# Patient Record
Sex: Male | Born: 1955
Health system: Southern US, Community
[De-identification: ages and names within clinical notes are randomized; demographics above are authoritative.]

## PROBLEM LIST (undated history)

## (undated) DIAGNOSIS — N529 Male erectile dysfunction, unspecified: Secondary | ICD-10-CM

## (undated) DIAGNOSIS — I447 Left bundle-branch block, unspecified: Secondary | ICD-10-CM

## (undated) DIAGNOSIS — I10 Essential (primary) hypertension: Secondary | ICD-10-CM

## (undated) DIAGNOSIS — E785 Hyperlipidemia, unspecified: Secondary | ICD-10-CM

## (undated) DIAGNOSIS — I428 Other cardiomyopathies: Secondary | ICD-10-CM

## (undated) DIAGNOSIS — S4990XA Unspecified injury of shoulder and upper arm, unspecified arm, initial encounter: Secondary | ICD-10-CM

## (undated) DIAGNOSIS — G4733 Obstructive sleep apnea (adult) (pediatric): Secondary | ICD-10-CM

## (undated) DIAGNOSIS — Y33XXXA Other specified events, undetermined intent, initial encounter: Secondary | ICD-10-CM

## (undated) DIAGNOSIS — E669 Obesity, unspecified: Secondary | ICD-10-CM

## (undated) HISTORY — PX: WRIST SURGERY: SHX841

## (undated) HISTORY — DX: Unspecified injury of shoulder and upper arm, unspecified arm, initial encounter: S49.90XA

## (undated) HISTORY — DX: Male erectile dysfunction, unspecified: N52.9

## (undated) HISTORY — PX: SHOULDER SURGERY: SHX246

## (undated) HISTORY — DX: Other specified events, undetermined intent, initial encounter: Y33.XXXA

## (undated) HISTORY — PX: UMBILICAL HERNIA REPAIR: SHX196

## (undated) HISTORY — DX: Essential (primary) hypertension: I10

## (undated) HISTORY — PX: LUMBAR DISC SURGERY: SHX700

## (undated) HISTORY — PX: KNEE SURGERY: SHX244

## (undated) HISTORY — DX: Other cardiomyopathies: I42.8

## (undated) HISTORY — DX: Obesity, unspecified: E66.9

## (undated) HISTORY — DX: Obstructive sleep apnea (adult) (pediatric): G47.33

## (undated) HISTORY — PX: APPENDECTOMY: SHX54

## (undated) HISTORY — DX: Hyperlipidemia, unspecified: E78.5

## (undated) HISTORY — DX: Left bundle-branch block, unspecified: I44.7

---

## 2006-11-22 ENCOUNTER — Inpatient Hospital Stay: Payer: Self-pay | Admitting: Vascular Surgery

## 2009-11-29 ENCOUNTER — Ambulatory Visit: Payer: Self-pay | Admitting: Orthopaedic Surgery

## 2010-01-13 ENCOUNTER — Encounter: Payer: Self-pay | Admitting: Internal Medicine

## 2010-01-16 ENCOUNTER — Telehealth (INDEPENDENT_AMBULATORY_CARE_PROVIDER_SITE_OTHER): Payer: Self-pay | Admitting: *Deleted

## 2010-01-17 ENCOUNTER — Ambulatory Visit: Payer: Self-pay | Admitting: Cardiovascular Disease

## 2010-01-17 ENCOUNTER — Ambulatory Visit: Payer: Self-pay

## 2010-01-17 ENCOUNTER — Ambulatory Visit: Payer: Self-pay | Admitting: Internal Medicine

## 2010-01-17 ENCOUNTER — Encounter (HOSPITAL_COMMUNITY): Admission: RE | Admit: 2010-01-17 | Discharge: 2010-03-23 | Payer: Self-pay | Admitting: Family Medicine

## 2010-01-17 DIAGNOSIS — I119 Hypertensive heart disease without heart failure: Secondary | ICD-10-CM | POA: Insufficient documentation

## 2010-02-01 ENCOUNTER — Ambulatory Visit: Payer: Self-pay | Admitting: Cardiovascular Disease

## 2010-02-01 ENCOUNTER — Encounter (INDEPENDENT_AMBULATORY_CARE_PROVIDER_SITE_OTHER): Payer: Self-pay | Admitting: *Deleted

## 2010-02-02 LAB — CONVERTED CEMR LAB
CO2: 28 meq/L (ref 19–32)
Chloride: 102 meq/L (ref 96–112)
HDL: 48 mg/dL (ref >39)
LDL Cholesterol: 152 mg/dL — ABNORMAL HIGH (ref 0–99)
Sodium: 142 meq/L (ref 135–145)
Total CHOL/HDL Ratio: 4.6
VLDL: 22 mg/dL (ref 0–40)

## 2010-02-07 ENCOUNTER — Encounter: Payer: Self-pay | Admitting: Cardiovascular Disease

## 2010-02-07 ENCOUNTER — Ambulatory Visit: Payer: Self-pay

## 2010-02-14 ENCOUNTER — Telehealth: Payer: Self-pay | Admitting: Cardiovascular Disease

## 2010-02-20 ENCOUNTER — Telehealth: Payer: Self-pay | Admitting: Cardiovascular Disease

## 2010-02-23 ENCOUNTER — Encounter: Payer: Self-pay | Admitting: Internal Medicine

## 2010-03-14 ENCOUNTER — Telehealth: Payer: Self-pay | Admitting: Cardiovascular Disease

## 2011-01-16 NOTE — Assessment & Plan Note (Signed)
Summary: Cardiology Nuclear Study  Nuclear Med Background Indications for Stress Test: Evaluation for Ischemia, Surgical Clearance, Abnormal EKG  Indications Comments: Pending (R) shoulder surgery- Dr. Ramon Dredge Armour [facility])   History Comments: NO DOCUMENTED CAD  Symptoms: Rapid HR    Nuclear Pre-Procedure Cardiac Risk Factors: Hypertension, LBBB, Obesity, Smoker Caffeine/Decaff Intake: none NPO After: 3:00 AM Lungs: Slightly deminished lower lobes, but clear.  O2 Sat 97% on RA. IV 0.9% NS with Angio Cath: 18g     IV Site: (R) AC IV Started by: Stanton Kidney EMT-P Chest Size (in) 264     Height (in): 72.5 Weight (lb): 264 BMI: 35.44 Tech Comments: Lisinopril/HCTZ taken this am.  BP= 188/110 @ 9:45 am, (R) arm. Patient still hypertensive during and after infusion.  BP after stress images 192/118. Smoker-occ. cigar  Nuclear Med Study 1 or 2 day study:  1 day     Stress Test Type:  Adenosine Reading MD:  Charlton Haws, MD     Referring MD:  Gwendlyn Deutscher. MD; WJ:XBJYNW Armour, MD [facility], [place]) Resting Radionuclide:  Technetium 79m Tetrofosmin     Resting Radionuclide Dose:  11.0 mCi  Stress Radionuclide:  Technetium 23m Tetrofosmin     Stress Radionuclide Dose:  33.0 mCi   Stress Protocol   Lexiscan: 0.4 mg   Stress Test Technologist:  Rea College CMA-N     Nuclear Technologist:  Burna Mortimer Deal RT-N  Rest Procedure  Myocardial perfusion imaging was performed at rest 45 minutes following the intravenous administration of Myoview Technetium 15m Tetrofosmin.  Stress Procedure  The patient received IV adenosine at 140 mcg/kg/min for 4 minutes. There was a brief episode of 2nd degree AVB with infusion. He did c/o chest tightness with adenosine.  Myoview was injected at the 2 minute mark and quantitative spect images were obtained after a 45 minute delay.  QPS Raw Data Images:  Normal; no motion artifact; normal heart/lung ratio. Stress Images:  NI: Uniform and normal uptake of  tracer in all myocardial segments. Rest Images:  Normal homogeneous uptake in all areas of the myocardium. Subtraction (SDS):  Normal Transient Ischemic Dilatation:  1.21  (Normal <1.22)  Lung/Heart Ratio:  .24  (Normal <0.45)  Quantitative Gated Spect Images QGS EDV:  302 ml QGS ESV:  215 ml QGS EF:  29 % QGS cine images:  LVE with global hypokinesis  Findings Low risk nuclear study Clinically Abnormal (chest pain, ST abnormality, hypotension)   Evidence for LV Dysfunction LV Dysfunction    Overall Impression  Exercise Capacity: Adenosine study with no exercise. BP Response: Normal blood pressure response. Clinical Symptoms: Chest Pain ECG Impression: LVH with strain.   Overall Impression: No evidence of ishcemia or infarction.  However severe LVE with decreased EF consistant with non-ischemic cardiomyopathy Overall Impression Comments: Dr. Graciela Husbands has seen patient

## 2011-01-16 NOTE — Progress Notes (Signed)
Summary: Nuclear Pre-Procedure  Phone Note Outgoing Call   Call placed by: Milana Na, EMT-P,  January 16, 2010 1:58 PM Summary of Call: Left message with information on Myoview Information Sheet (see scanned document for details).      Nuclear Med Background Indications for Stress Test: Evaluation for Ischemia, Surgical Clearance        Nuclear Pre-Procedure Cardiac Risk Factors: Hypertension, LBBB  Nuclear Med Study Referring MD:  Gwendlyn Deutscher

## 2011-01-16 NOTE — Assessment & Plan Note (Signed)
Summary: EC6/AMD   Visit Type:  EC6 Referring Provider:  Sherryl Manges, MD Primary Provider:  Gwendlyn Deutscher, MD  CC:  hypertension, no cp, no sob, and no edema.  History of Present Illness: Mr. Casey Hess is a very pleasant 55 year old gentleman with a history of hypertension who presents for preop evaluation.  He was referred by his premature physician, Dr. Jeanie Sewer for a stress test. The stress test showed no significant ischemia, ejection fraction 29%. He was felt that he had a nonischemic cardiomyopathy.  Patient denies any significant shortness of breath, chest pain, lower extremity edema. He has no diabetes. He is very active in the gym, walks 3-5 miles per day. His blood pressure was very elevated on the day of the stress test with 180 systolic and 190 after the stress part of the test. He was started on several blood pressure medications. He states that on the medications his blood pressure has improved though [facility].  he does have significant right shoulder pain over the past several weeks and is anxious to have surgery at [facility] orthopedics, Dr. Mack Guise.  Patient states that he has lost 10 pounds over the past 3 weeks and plans to lose an additional 20-30 pounds.  Cholesterol rechecked in the office today.  Current Problems (verified): 1)  Hypertension, Heart Controlled w/o Assoc CHF  (ICD-402.10) 2)  Cardiomyopathy, Secondary Prob Htn  (ICD-425.9)  Current Medications (verified): 1)  Coreg 6.25 Mg Tabs (Carvedilol) .... Two Times A Day 2)  Aldactone 25 Mg Tabs (Spironolactone) .... Once Daily 3)  Lisinopril-Hydrochlorothiazide 20-12.5 Mg Tabs (Lisinopril-Hydrochlorothiazide) .Marland Kitchen.. 1 By Mouth Once Daily  Allergies (verified): No Known Drug Allergies  Past History:  Past Medical History: Last updated: 01/17/2010 obstructive sleep apnea Obesity Hypertension electrocution injury Shoulder injury  Past Surgical History: Last updated:  01/17/2010 shoulder surgery Right wrist Knee surgery Umbilical hernia repair Discectomy Appendectomy  Family History: Last updated: 01/17/2010 Negative FH of Diabetes, Hypertension, or Coronary Artery Disease  Social History: Last updated: 01/17/2010 separated, 3 children, son is in a [employer] combine  Review of Systems       Shoulder pain  Vital Signs:  Patient profile:   55 year old male Height:      72.5 inches Weight:      256.50 pounds BMI:     34.43 Pulse rate:   68 / minute Pulse rhythm:   regular BP sitting:   138 / 88  (left arm) Cuff size:   large  Vitals Entered By: Mercer Pod (February 01, 2010 10:04 AM)  Physical Exam  General:  well-appearing gentleman in no apparent distress, alert and oriented x3, HEENT exam is benign, neck is supple, no JVP or carotid bruits, heart sounds are regular with normal S1-S2 and no murmurs appreciated, lungs are clear to auscultation with no wheezes or rales, abdominal exam is benign, trace lower extremity edema bilaterally to the midshin, neurologic exam is grossly nonfocal, skin is warm and dry.   New Orders:     1)  T-Basic Metabolic Panel [id number])     2)  T-Lipid Profile ([id number])     3)  Echocardiogram (Echo)   Problems:  Medical Problems Added: 1)  Dx of Pre-operative Cardiac Exam  (ICD-V72.81)  Impression & Recommendations:  Problem # 1:  PRE-OPERATIVE CARDIAC EXAM (ICD-V72.81) patient has no symptoms, has a negative stress test. It is interesting that his ejection fraction is low suggesting a nonischemic cardiomyopathy and we will confirm this  with an echocardiogram. He has been scheduled for this for this week prior to his surgery. He was started on medical management for his blood pressure and for presumed cardiomyopathy. He is low risk for his upcoming shoulder surgery and this can be scheduled at any time.  His updated medication list for this problem includes:    Coreg 6.25 Mg Tabs  (Carvedilol) .Marland Kitchen..Marland Kitchen Two times a day    Lisinopril-hydrochlorothiazide 20-12.5 Mg Tabs (Lisinopril-hydrochlorothiazide) .Marland Kitchen... 2 by mouth once daily  Problem # 2:  HYPERTENSION, HEART CONTROLLED W/O ASSOC CHF (ICD-402.10) on the medication, his blood pressure has significantly improved. I've asked him to monitor his pressure with a home blood pressure cuff. If it continues to be elevated, he could take 1-1/2 tablets of his lisinopril HCT or  titrate up to 2 tablets if indicated. His updated medication list for this problem includes:    Coreg 6.25 Mg Tabs (Carvedilol) .Marland Kitchen..Marland Kitchen Two times a day    Aldactone 25 Mg Tabs (Spironolactone) ..... Once daily    Lisinopril-hydrochlorothiazide 20-12.5 Mg Tabs (Lisinopril-hydrochlorothiazide) .Marland Kitchen... 2 by mouth once daily  Problem # 3:  CARDIOMYOPATHY, SECONDARY PROB HTN (ICD-425.9) his low ejection fraction in the stress test will be confirmed by echocardiography. It is possible that this is a false reading as he has no significant symptoms of heart failure. His updated medication list for this problem includes:    Coreg 6.25 Mg Tabs (Carvedilol) .Marland Kitchen..Marland Kitchen Two times a day    Aldactone 25 Mg Tabs (Spironolactone) ..... Once daily    Lisinopril-hydrochlorothiazide 20-12.5 Mg Tabs (Lisinopril-hydrochlorothiazide) .Marland Kitchen... 2 by mouth once daily  Orders: T-Basic Metabolic Panel [id number]) T-Lipid Profile [id number]) Echocardiogram (Echo)  Patient Instructions: 1)  Your physician recommends that you schedule a follow-up appointment in: 1 year 2)  Your physician has recommended you make the following change in your medication: increase lisinopril - hct to 40/25 mg daily 3)  Your physician has requested that you have an echocardiogram.  Echocardiography is a painless test that uses sound waves to create images of your heart. It provides your doctor with information about the size and shape of your heart and how well your heart's chambers and valves are working.  This  procedure takes approximately one hour. There are no restrictions for this procedure. 4)  Your physician has requested that you regularly monitor and record your blood pressure readings at home.  Please use the same machine at the same time of day to check your readings and record them to bring to your follow-up visit. Prescriptions: LISINOPRIL-HYDROCHLOROTHIAZIDE 20-12.5 MG TABS (LISINOPRIL-HYDROCHLOROTHIAZIDE) 2 by mouth once daily  #60 x 6   Entered by:   Charlena Cross, RN, BSN   Authorized by:   Dossie Arbour MD   Signed by:   Charlena Cross, RN, BSN on 02/01/2010   Method used:   [facility]        [facility] [phone]* (retail)       [address] [place], [place]  [address]       Ph: [phone] or [phone]       Fax: [phone]   RxID:   [id number]

## 2011-01-16 NOTE — Assessment & Plan Note (Signed)
Summary: add on/abd. stress test/saf   Referring Provider:  redding   History of Present Illness: Casey Hess is seen today as an add-on following a Myoview scan scheduled by Dr. Gwendlyn Deutscher in [place] because of hypertension left bundle branch block and need for preoperative clearance. This scan demonstrated no ischemia but an ejection fraction of 29%.  The patient has newly identified and poorly controlled hypertension. Blood pressures have been recently in the 180-190/100-20 range.  he denies shortness of breath exercise intolerance peripheral edema or syncope. There's also been no chest pain. He uses alcohol daily and volumes greater than 2 drinks per day; he smokes cigars occasionally and uses periodic cocaine and marijuana.  He is needing to undergo shoulder surgery. This has been quite painful. He was recently aggravated by having gotten cotton quicksand and needed to pull himself out of his bad shoulder.  Past History:  Past Medical History: obstructive sleep apnea Obesity Hypertension electrocution injury Shoulder injury  Past Surgical History: shoulder surgery Right wrist Knee surgery Umbilical hernia repair Discectomy Appendectomy  Family History: Negative FH of Diabetes, Hypertension, or Coronary Artery Disease  Social History: separated, 3 children, son is in a [employer] combine  Review of Systems       full review of systems was negative apart from a history of present illness and past medical history.   Physical Exam  General:  Alert and oriented middle-age Caucasian male appearing his stated age of 38 in no acute distress. HEENT  normal . Neck veins were flat; carotids brisk and full without bruits. No lymphadenopathy. Back without kyphosis. Lungs clear. Heart sounds regular without murmurs or gallops. PMI displaced. Abdomen soft with active bowel sounds without midline pulsation or hepatomegaly. Femoral pulses and distal pulses intact. Extremities were without  clubbing cyanosis or edemaSkin warm and dry. Neurological exam grossly normal affect was normal   EKG  Procedure date:  02/03/2010  Findings:      sinus rhythm 85 Intervals 0.15/0.15/0.42 Left bundle branch block  Impression & Recommendations:  Problem # 1:  CARDIOMYOPATHY, SECONDARY PROB HTN (ICD-425.9) Casey Hess has a newly identified cardiomyopathy. He is currently on ACE inhibitor therapy for his blood pressure. We'll add carvedilol 6.25 twice a day and Aldactone. Reviewing laboratories from last week his potassium was 3.9 and his renal function was normal. You will need to have his potassium rechecked when he comes in to see Dr. Mariah Milling in [facility] for followup.  I reviewed with the patient extensively the potential prognostic implications of his cardiomyopathy. We have reviewed further the importance of medical therapy or pressure control and relative abstinence from alcohol and recreational drugs. He verbalizes understanding. His updated medication list for this problem includes:  for right now we will postpone his surgery until he is seen by Dr. Mariah Milling    Coreg 6.25 Mg Tabs (Carvedilol) .Marland Kitchen..Marland Kitchen Two times a day    Aldactone 25 Mg Tabs (Spironolactone) ..... Once daily  Problem # 2:  HYPERTENSION, HEART CONTROLLED W/O ASSOC CHF (ICD-402.10) his blood pressure is poorly controlled. Will add carvedilol which also has alpha antagonism AND ADD  Aldactone. Hopefully this will allow for control of his blood pressure and help heal his cardiomyopathy His updated medication list for this problem includes:    Coreg 6.25 Mg Tabs (Carvedilol) .Marland Kitchen..Marland Kitchen Two times a day    Aldactone 25 Mg Tabs (Spironolactone) ..... Once daily  Patient Instructions: 1)  Your physician has recommended you make the following change in your medication: START  COREG 6.25MG  two times a day  2)  START ALDACTONE 25MG  once daily  3)  Your physician recommends that you schedule a follow-up appointment in: 2 WEEKS WITH  DR. Alcide Goodness Prescriptions: ALDACTONE 25 MG TABS (SPIRONOLACTONE) once daily  #30 x 6   Entered by:   Duncan Dull, RN, BSN   Authorized by:   Nathen May, MD, [facility]   Signed by:   Duncan Dull, RN, BSN on 01/17/2010   Method used:   [facility]        [facility] [phone]* (retail)       [address]       [place], [place]  [address]       Ph: [phone] or [phone]       Fax: [phone]   RxID:   [id number] COREG 6.25 MG TABS (CARVEDILOL) two times a day  #60 x 6   Entered by:   Duncan Dull, RN, BSN   Authorized by:   Nathen May, MD, [facility]   Signed by:   Duncan Dull, RN, BSN on 01/17/2010   Method used:   Electronically to        [facility] [phone]* (retail)       [address] [place], [place]  [address]       Ph: [phone] or [phone]       Fax: [phone]   RxID:   [id number]

## 2011-01-16 NOTE — Letter (Signed)
Summary: [facility] Family Office Note  [employer] Family Office Note   Imported By: Roderic Ovens 03/08/2010 15:28:06  _____________________________________________________________________  External Attachment:    Type:   Image     Comment:   External Document

## 2011-01-16 NOTE — Letter (Signed)
Summary: Medical Record Release  Medical Record Release   Imported By: Harlon Flor 03/10/2010 08:32:35  _____________________________________________________________________  External Attachment:    Type:   Image     Comment:   External Document

## 2011-01-16 NOTE — Progress Notes (Signed)
Summary: ALLERGIES  Phone Note Call from Patient Call back at Home Phone [phone]   Caller: SELF Call For: [facility] Summary of Call: NEEDS TO KNOW WHAT KIND OF ALLERGY MEDICINE HE CAN TAKE Initial call taken by: Harlon Flor,  March 14, 2010 3:48 PM  Follow-up for Phone Call        informed pt that he can take zyrtec otc. Charlena Cross RN BSN  Follow-up by: Charlena Cross, RN, BSN,  March 14, 2010 3:50 PM

## 2011-01-16 NOTE — Progress Notes (Signed)
Summary: Cough  Phone Note Call from Patient Call back at Home Phone [phone]   Caller: Patient Call For: Melissa and Dr. Mariah Milling Summary of Call: Patient said he was told to call back if his cough still persists.  He called this morning and is still having the cough and his blood pressure is running high since the change in medications.  Would like for Melissa to call him back. Initial call taken by: [place],  February 20, 2010 11:35 AM  Follow-up for Phone Call        Add amlodipine 10 mg daily for BP     Appended Document: Cough pt aware. will call with BP results and if cough persists. Charlena Cross RN BSN    Clinical Lists Changes

## 2011-01-16 NOTE — Progress Notes (Signed)
Summary: MEDICATIONS  Phone Note Call from Patient Call back at Home Phone [phone]   Caller: SELF Call For: [facility] Summary of Call: PT NEEDS TO DISCUSS MEDICATIONS BECAUSE THEY ARE GIVING HIM A CHRONIC COUGH Initial call taken by: Harlon Flor,  February 14, 2010 3:41 PM  Follow-up for Phone Call        per Dr. Mariah Milling- change lisinopril hct to lasartan hct 100/25. pt to call is cough persists. Follow-up by: Charlena Cross, RN, BSN,  February 14, 2010 4:57 PM    New/Updated Medications: LOSARTAN POTASSIUM-HCTZ 100-25 MG TABS (LOSARTAN POTASSIUM-HCTZ) 1 tab by mouth daily Prescriptions: LOSARTAN POTASSIUM-HCTZ 100-25 MG TABS (LOSARTAN POTASSIUM-HCTZ) 1 tab by mouth daily  #30 x 6   Entered by:   Charlena Cross, RN, BSN   Authorized by:   Dossie Arbour MD   Signed by:   Charlena Cross, RN, BSN on 02/14/2010   Method used:   [facility] to        [facility] [phone]* (retail)       [address] [place], [place]  [address]       Ph: [phone] or [phone]       Fax: [phone]   RxID:   [id number]   Appended Document: MEDICATIONS    Clinical Lists Changes  Medications: Added new medication of AMLODIPINE BESYLATE 10 MG TABS (AMLODIPINE BESYLATE) Take one tablet by mouth daily - Signed Rx of AMLODIPINE BESYLATE 10 MG TABS (AMLODIPINE BESYLATE) Take one tablet by mouth daily;  #30 x 3;  Signed;  Entered by: Charlena Cross, RN, BSN;  Authorized by: Dossie Arbour MD;  Method used: [facility] [phone]*, [address], [place], [place], [place]  [address], Ph: [phone] or [phone], Fax: [phone]    Prescriptions: AMLODIPINE BESYLATE 10 MG TABS (AMLODIPINE BESYLATE) Take one tablet by mouth daily  #30 x 3   Entered by:   Charlena Cross, RN, BSN   Authorized by:   Dossie Arbour MD   Signed by:   Charlena Cross, RN, BSN on 02/21/2010   Method used:   [facility]ronically to        [facility]  [phone]* (retail)       [address] [place], [place]  [address]       Ph: [phone] or [phone]       Fax: [phone]   RxID:   [id number]

## 2011-01-16 NOTE — Letter (Signed)
Summary: Medical Record Release  Medical Record Release   Imported By: Harlon Flor 02/27/2010 15:59:48  _____________________________________________________________________  External Attachment:    Type:   Image     Comment:   External Document

## 2011-04-06 ENCOUNTER — Other Ambulatory Visit: Payer: Self-pay | Admitting: *Deleted

## 2011-04-06 MED ORDER — CARVEDILOL 6.25 MG PO TABS: 6.2500 mg | ORAL_TABLET | Freq: Two times a day (BID) | ORAL | Status: DC

## 2011-04-16 ENCOUNTER — Other Ambulatory Visit: Payer: Self-pay | Admitting: Emergency Medicine

## 2011-04-16 MED ORDER — LOSARTAN POTASSIUM-HCTZ 100-25 MG PO TABS: 1.0000 | ORAL_TABLET | Freq: Every day | ORAL | Status: DC

## 2011-04-23 ENCOUNTER — Ambulatory Visit (INDEPENDENT_AMBULATORY_CARE_PROVIDER_SITE_OTHER): Payer: BC Managed Care – PPO | Admitting: Cardiovascular Disease

## 2011-04-23 ENCOUNTER — Encounter: Payer: Self-pay | Admitting: Cardiovascular Disease

## 2011-04-23 DIAGNOSIS — I119 Hypertensive heart disease without heart failure: Secondary | ICD-10-CM

## 2011-04-23 DIAGNOSIS — E669 Obesity, unspecified: Secondary | ICD-10-CM

## 2011-04-23 DIAGNOSIS — E785 Hyperlipidemia, unspecified: Secondary | ICD-10-CM

## 2011-04-23 MED ORDER — ATORVASTATIN CALCIUM 10 MG PO TABS: 10.0000 mg | ORAL_TABLET | Freq: Every day | ORAL | Status: DC

## 2011-04-23 MED ORDER — CARVEDILOL 6.25 MG PO TABS: 6.2500 mg | ORAL_TABLET | Freq: Two times a day (BID) | ORAL | Status: DC

## 2011-04-23 MED ORDER — LOSARTAN POTASSIUM-HCTZ 100-25 MG PO TABS: 1.0000 | ORAL_TABLET | Freq: Every day | ORAL | Status: DC

## 2011-04-23 MED ORDER — AMLODIPINE BESYLATE 10 MG PO TABS: 10.0000 mg | ORAL_TABLET | Freq: Every day | ORAL | Status: DC

## 2011-04-23 NOTE — Assessment & Plan Note (Signed)
We will hold his Aldactone as the echocardiogram did not confirm a significant cardiomyopathy as was suggested by the ejection fraction on a stress test. His blood pressure is excellent. We have suggested that if his blood pressure goes too low, we could cut one of his medications and half either the amlodipine or the losartan.

## 2011-04-23 NOTE — Assessment & Plan Note (Signed)
We have encouraged continued exercise, careful diet management in an effort to lose weight. 

## 2011-04-23 NOTE — Patient Instructions (Signed)
You are doing well. Start Lipitor 10mg  once in the evening. Stop Aldactone. Please call us if you have new issues that need to be addressed before your next appt.  Your physician recommends that you schedule a follow-up appointment in: 1 year Your physician recommends that you return for a FASTING lipid profile: 2-3 months (lipid/lft)

## 2011-04-23 NOTE — Progress Notes (Signed)
   Patient ID: Casey Ringer., male    DOB: 1956/09/15, 55 y.o.   MRN: [id number]  HPI Comments: Casey Hess is a very pleasant 55 year old gentleman with a history of hypertension who for routine followup.   He had a stress test last year that showed no ischemia but did suggest low ejection fraction. Followup echocardiogram showed ejection fraction 50-55% with mildly dilated left ventricle and mild LVH otherwise a normal study.  He feels well with no complaints of shortness of breath, edema, chest pain. His blood pressure has been running in an excellent range. He reports that he recently had his cholesterol checked in that his total cholesterol was greater than 230.   s/p right shoulder surgery  at [facility] orthopedics, Dr. Mack Guise.   EKG: NSR with rate 61 bpm, LBBB      Review of Systems  Constitutional: Negative.   HENT: Negative.   Eyes: Negative.   Respiratory: Negative.   Cardiovascular: Negative.   Gastrointestinal: Negative.   Musculoskeletal: Negative.   Skin: Negative.   Neurological: Negative.   Hematological: Negative.   Psychiatric/Behavioral: Negative.   All other systems reviewed and are negative.   BP 128/78  Pulse 61  Ht 6\' 1"  (1.854 m)  Wt 266 lb 1.9 oz (120.711 kg)  BMI 35.11 kg/m2   Physical Exam  Nursing note and vitals reviewed. Constitutional: He is oriented to person, place, and time. He appears well-developed and well-nourished.  HENT:  Head: Normocephalic.  Nose: Nose normal.  Mouth/Throat: Oropharynx is clear and moist.  Eyes: Conjunctivae are normal. Pupils are equal, round, and reactive to light.  Neck: Normal range of motion. Neck supple. No JVD present.  Cardiovascular: Normal rate, regular rhythm, S1 normal, S2 normal, normal heart sounds and intact distal pulses.  Exam reveals no gallop and no friction rub.   No murmur heard. Pulmonary/Chest: Effort normal and breath sounds normal. No respiratory distress. He has no wheezes. He  has no rales. He exhibits no tenderness.  Abdominal: Soft. Bowel sounds are normal. He exhibits no distension. There is no tenderness.  Musculoskeletal: Normal range of motion. He exhibits no edema and no tenderness.  Lymphadenopathy:    He has no cervical adenopathy.  Neurological: He is alert and oriented to person, place, and time. Coordination normal.  Skin: Skin is warm and dry. No rash noted. No erythema.  Psychiatric: He has a normal mood and affect. His behavior is normal. Judgment and thought content normal.           Assessment and Plan

## 2011-04-23 NOTE — Assessment & Plan Note (Signed)
We will try to obtain his most recent lipid panel for our records. They are likely being scanned into our medical record system. He did report a total cholesterol greater than 230. He is receptive to starting a statin. We will try Lipitor 10 mg daily. We have suggested that if this is too expensive, we could use a generic for several months before Lipitor drops in price.

## 2011-04-30 ENCOUNTER — Encounter: Payer: Self-pay | Admitting: Cardiovascular Disease

## 2011-11-27 ENCOUNTER — Ambulatory Visit: Payer: Self-pay | Admitting: Sports Medicine

## 2011-12-06 ENCOUNTER — Ambulatory Visit: Payer: Self-pay | Admitting: Orthopedic Surgery

## 2012-04-22 ENCOUNTER — Other Ambulatory Visit: Payer: Self-pay | Admitting: Cardiovascular Disease

## 2012-04-23 ENCOUNTER — Other Ambulatory Visit: Payer: Self-pay | Admitting: *Deleted

## 2012-04-23 MED ORDER — AMLODIPINE BESYLATE 10 MG PO TABS: 10.0000 mg | ORAL_TABLET | Freq: Every day | ORAL | Status: DC

## 2012-04-23 MED ORDER — LOSARTAN POTASSIUM-HCTZ 100-25 MG PO TABS: 1.0000 | ORAL_TABLET | Freq: Every day | ORAL | Status: DC

## 2012-04-23 NOTE — Telephone Encounter (Signed)
Refilled Amlodipine and Losartan. 

## 2012-05-08 ENCOUNTER — Encounter: Payer: Self-pay | Admitting: Cardiovascular Disease

## 2012-05-08 ENCOUNTER — Ambulatory Visit (INDEPENDENT_AMBULATORY_CARE_PROVIDER_SITE_OTHER): Payer: BC Managed Care – PPO | Admitting: Cardiovascular Disease

## 2012-05-08 VITALS — BP 130/80 | HR 76 | Ht 73.0 in | Wt 256.8 lb

## 2012-05-08 DIAGNOSIS — I119 Hypertensive heart disease without heart failure: Secondary | ICD-10-CM

## 2012-05-08 DIAGNOSIS — E785 Hyperlipidemia, unspecified: Secondary | ICD-10-CM

## 2012-05-08 DIAGNOSIS — N529 Male erectile dysfunction, unspecified: Secondary | ICD-10-CM | POA: Insufficient documentation

## 2012-05-08 DIAGNOSIS — E669 Obesity, unspecified: Secondary | ICD-10-CM

## 2012-05-08 NOTE — Progress Notes (Signed)
   Patient ID: Casey Ringer., male    DOB: 1956-05-17, 56 y.o.   MRN: [id number]  HPI Comments: Mr. Casey Hess is a very pleasant 56 year old gentleman with a history of hypertension and hyperlipidemia who presents for a routine followup.   He had a stress test last year that showed no ischemia but did suggest low ejection fraction. Followup echocardiogram showed ejection fraction 50-55% with mildly dilated left ventricle and mild LVH otherwise a normal study.  He feels well with no complaints of shortness of breath, edema, chest pain. He has been working out at [employer] several times per week and reports a 20 pound weight loss. His blood pressure has been running in an excellent range.  Lipitor caused significant myalgias and he is stop the medication. He reports having recent lab work through his primary care physician.   s/p right shoulder surgery  at [facility] orthopedics, Dr. Mack Guise.   EKG: NSR with rate 61 bpm, LBBB    Outpatient Encounter Prescriptions as of 05/08/2012  Medication Sig Dispense Refill  . amLODipine (NORVASC) 10 MG tablet Take 1 tablet (10 mg total) by mouth daily.  90 tablet  3  . carvedilol (COREG) 6.25 MG tablet Take 6.25 mg by mouth 2 (two) times daily with a meal.      . Cyanocobalamin (VITAMIN B-12 PO) Take by mouth daily.      Marland Kitchen losartan-hydrochlorothiazide (HYZAAR) 100-25 MG per tablet Take 1 tablet by mouth daily.  90 tablet  3  . Omega-3 Fatty Acids (FISH OIL) 1200 MG CAPS Take by mouth daily. Takes 3 tablets daily       Review of Systems  Constitutional: Negative.   HENT: Negative.   Eyes: Negative.   Respiratory: Negative.   Cardiovascular: Negative.   Gastrointestinal: Negative.   Musculoskeletal: Negative.   Skin: Negative.   Neurological: Negative.   Hematological: Negative.   Psychiatric/Behavioral: Negative.   All other systems reviewed and are negative.   BP 130/80  Pulse 76  Ht 6\' 1"  (1.854 m)  Wt 256 lb 12 oz (116.461 kg)  BMI 33.87  kg/m2  Physical Exam  Nursing note and vitals reviewed. Constitutional: He is oriented to person, place, and time. He appears well-developed and well-nourished.  HENT:  Head: Normocephalic.  Nose: Nose normal.  Mouth/Throat: Oropharynx is clear and moist.  Eyes: Conjunctivae are normal. Pupils are equal, round, and reactive to light.  Neck: Normal range of motion. Neck supple. No JVD present.  Cardiovascular: Normal rate, regular rhythm, S1 normal, S2 normal, normal heart sounds and intact distal pulses.  Exam reveals no gallop and no friction rub.   No murmur heard. Pulmonary/Chest: Effort normal and breath sounds normal. No respiratory distress. He has no wheezes. He has no rales. He exhibits no tenderness.  Abdominal: Soft. Bowel sounds are normal. He exhibits no distension. There is no tenderness.  Musculoskeletal: Normal range of motion. He exhibits no edema and no tenderness.  Lymphadenopathy:    He has no cervical adenopathy.  Neurological: He is alert and oriented to person, place, and time. Coordination normal.  Skin: Skin is warm and dry. No rash noted. No erythema.  Psychiatric: He has a normal mood and affect. His behavior is normal. Judgment and thought content normal.           Assessment and Plan

## 2012-05-08 NOTE — Assessment & Plan Note (Signed)
We have given him some samples of Cialis to try. He will call back for a prescription/

## 2012-05-08 NOTE — Assessment & Plan Note (Signed)
Blood pressure is well controlled. No medication changes made

## 2012-05-08 NOTE — Patient Instructions (Signed)
You are doing well. No medication changes were made.  Please call us if you have new issues that need to be addressed before your next appt.  Your physician wants you to follow-up in: 6 months.  You will receive a reminder letter in the mail two months in advance. If you don't receive a letter, please call our office to schedule the follow-up appointment.   

## 2012-05-08 NOTE — Assessment & Plan Note (Signed)
We'll try to find the most recent blood work records.

## 2012-05-08 NOTE — Assessment & Plan Note (Signed)
We have encouraged continued exercise, careful diet management in an effort to lose weight. 

## 2012-06-09 ENCOUNTER — Telehealth: Payer: Self-pay | Admitting: Cardiovascular Disease

## 2012-06-09 MED ORDER — TADALAFIL 5 MG PO TABS: 5.0000 mg | ORAL_TABLET | Freq: Every day | ORAL | Status: DC | PRN

## 2012-06-09 NOTE — Telephone Encounter (Signed)
Refilled Cialis

## 2012-06-09 NOTE — Telephone Encounter (Signed)
Pt was given Cialis and was told to call if it worked he would like a refill called in to [facility] [address]. TODAY he is going out of town for a month.

## 2012-07-17 ENCOUNTER — Other Ambulatory Visit: Payer: Self-pay | Admitting: Cardiovascular Disease

## 2012-07-17 MED ORDER — TADALAFIL 5 MG PO TABS: 5.0000 mg | ORAL_TABLET | Freq: Every day | ORAL | Status: DC | PRN

## 2012-07-18 ENCOUNTER — Telehealth: Payer: Self-pay

## 2012-07-18 ENCOUNTER — Other Ambulatory Visit: Payer: Self-pay

## 2012-07-18 MED ORDER — TADALAFIL 20 MG PO TABS: ORAL_TABLET | ORAL | Status: DC

## 2012-07-18 NOTE — Telephone Encounter (Signed)
Pt calling to say we refilled his cialis RX today but pharmacy only gave him 5 mg tablets x 4. He says we gave him samples of 20 mg tabs at last OV and he split these in half and tolerated well. He asks if we can call in 20 mg tabs and give him more tablets. I told him we would send in 1 refill and I would discuss with Dr. Mariah Milling . Understanding verb.

## 2012-07-20 NOTE — Telephone Encounter (Signed)
Yes, cialis 20 mg ok I only do 5 mg when I do 30 days

## 2012-07-23 ENCOUNTER — Other Ambulatory Visit: Payer: Self-pay | Admitting: Cardiovascular Disease

## 2012-11-05 ENCOUNTER — Other Ambulatory Visit: Payer: Self-pay | Admitting: Cardiovascular Disease

## 2012-11-05 NOTE — Telephone Encounter (Signed)
Refilled Cialis

## 2013-02-03 ENCOUNTER — Other Ambulatory Visit: Payer: Self-pay | Admitting: Cardiovascular Disease

## 2013-02-03 NOTE — Telephone Encounter (Signed)
lmtcb pt is overdue for 6 month f/u with Dr.Gollan needs to schedule appointment. Sent in refill for amlodipine and losartan/hctz to pharmacy with 1 refill.

## 2013-04-27 ENCOUNTER — Encounter: Payer: Self-pay | Admitting: Physician Assistant

## 2013-04-27 ENCOUNTER — Ambulatory Visit (INDEPENDENT_AMBULATORY_CARE_PROVIDER_SITE_OTHER): Payer: BC Managed Care – PPO | Admitting: Physician Assistant

## 2013-04-27 VITALS — BP 140/92 | HR 64 | Ht 73.0 in | Wt 270.2 lb

## 2013-04-27 DIAGNOSIS — R609 Edema, unspecified: Secondary | ICD-10-CM

## 2013-04-27 DIAGNOSIS — I119 Hypertensive heart disease without heart failure: Secondary | ICD-10-CM

## 2013-04-27 DIAGNOSIS — R600 Localized edema: Secondary | ICD-10-CM | POA: Insufficient documentation

## 2013-04-27 DIAGNOSIS — I10 Essential (primary) hypertension: Secondary | ICD-10-CM | POA: Insufficient documentation

## 2013-04-27 DIAGNOSIS — N529 Male erectile dysfunction, unspecified: Secondary | ICD-10-CM

## 2013-04-27 DIAGNOSIS — E785 Hyperlipidemia, unspecified: Secondary | ICD-10-CM | POA: Insufficient documentation

## 2013-04-27 MED ORDER — HYDRALAZINE HCL 25 MG PO TABS: 25.0000 mg | ORAL_TABLET | Freq: Two times a day (BID) | ORAL | Status: DC

## 2013-04-27 MED ORDER — TADALAFIL 20 MG PO TABS: 20.0000 mg | ORAL_TABLET | ORAL | Status: DC | PRN

## 2013-04-27 NOTE — Assessment & Plan Note (Signed)
Lipitor-intolerant. He developed significant myalgias on this. Advised to follow-up with PCP for repeat fasting lipids. Discussed diet and exercise as a means of lipid management. If remains elevated on follow-up, will consider trial of alternative lower potency statin.  

## 2013-04-27 NOTE — Assessment & Plan Note (Signed)
Duration x 1 year. Worse after working on his feet through out the day, resolved in the AM. Denies CHF-type symptoms in PND, orthopnea, SOB/DOE or sudden weight gain. Will replace amlodipine with hydralazine to phase out known SE of the former. With prior reduced EF on stress test 2012, will repeat 2D echo to rule out progressive cardiomyopathy/CHF.

## 2013-04-27 NOTE — Patient Instructions (Addendum)
Please take all new medications/medication changes as prescribed.   Please continue to monitor your blood pressure throughout the day. If you notice your blood pressure is elevated in the middle of the day, we will increase hydralazine to three times a day.   Please follow-up with your PCP soon to have your cholesterol checked.  Please follow-up with your orthopedist to consider steroid injections in your knee.   Please reduce NSAID use (Aleve, ibuprofen, Advil). Please reduce salt intake.   We will schedule a repeat echocardiogram next week and call you with an appointment date and time.

## 2013-04-27 NOTE — Assessment & Plan Note (Signed)
Lipitor-intolerant. He developed significant myalgias on this. Advised to follow-up with PCP for repeat fasting lipids. Discussed diet and exercise as a means of lipid management. If remains elevated on follow-up, will consider trial of alternative lower potency statin.

## 2013-04-27 NOTE — Assessment & Plan Note (Signed)
Mildly elevated today. May be attributed to liberal NSAID use from chronic R knee pain and acute stress. Will replace Norvasc with hydralazine for BP control in the setting of LE edema. Have also advised to limit salt intake and continue to follow-up with PCP for OSA management. He will continue to monitor BP and call the office with any new complaints.

## 2013-04-27 NOTE — Progress Notes (Signed)
    Date:  04/27/2013   ID:  Casey Ringer., DOB April 08, 1956, MRN [id number]  PCP:  Noni Saupe., MD  Primary Cardiologist:  Concha Se, MD   History of Present Illness: Casey Gann. is a 57 y.o. male with PMHx s/f HTN, HLD, erectile dysfunction, OSA and obesity who presents today for follow-up.   He had a stress test in 2012 which revealed no evidenced of ischemia, but reduced EF. A follow-up 2D echo indicated EF 50-55% with mildly dilated LV and mild LVH, otherwise normal study.   He indicates bilateral ankle swelling which increases throughout the day, and alleviates completely on waking in the AM. He states this has been present x 1 year, but forgot to mention this on prior follow-up. He denies SOB/DOE, PND, orthopnea or sudden weight gain. No chest pain or palpitations. He does consume salty foods (canned foods, frozen foods) on occasion. He has noted increased R knee pain (multiple prior arthroscopies) limiting his ability to exercise. As a result, he has been liberally taking Aleve to relieve his discomfort. He also notes significant acute stress due to family issues. He continues to take all antihypertensives as prescribed. He has been on Lipitor in the past, but was unable to tolerate due to myalgias. ED improved on Cialis.  EKG today reveals NSR, 64 bpm, LBBB (chronic)   BP 140/92.   Wt Readings from Last 3 Encounters:  04/27/13 270 lb 4 oz (122.585 kg)  05/08/12 256 lb 12 oz (116.461 kg)  04/23/11 266 lb 1.9 oz (120.711 kg)     Past Medical History  Diagnosis Date  . Obstructive sleep apnea   . Obesity   . Hypertension   . Injury by electrocution   . Injury of shoulder   . Hyperlipidemia     Current Outpatient Prescriptions  Medication  Sig  Dispense  Refill   .  amLODipine (NORVASC) 10 MG tablet  Take 1 tablet (10 mg total) by mouth daily.  90 tablet  3   .  carvedilol (COREG) 6.25 MG tablet  Take 6.25 mg by mouth 2 (two) times daily with a meal.      .  Cyanocobalamin (VITAMIN B-12 PO)  Take by mouth daily.     Marland Kitchen  losartan-hydrochlorothiazide (HYZAAR) 100-25 MG per tablet  Take 1 tablet by mouth daily.  90 tablet  3   .  Omega-3 Fatty Acids (FISH OIL) 1200 MG CAPS  Take by mouth daily. Takes 3 tablets daily      Allergies:   No Known Allergies  Social History:  The patient  reports that he has never smoked. His smokeless tobacco use includes Snuff. He reports that  drinks alcohol. He reports that he does not use illicit drugs.   ROS:  Please see the history of present illness.  All other systems reviewed and negative.   PHYSICAL EXAM: VS:  BP 140/92  Pulse 64  Ht 6\' 1"  (1.854 m)  Wt 270 lb 4 oz (122.585 kg)  BMI 35.66 kg/m2  Well nourished, well developed, in no acute distress HEENT: normal Neck: no JVD Cardiac:  normal S1, S2; RRR; no murmur Lungs:  clear to auscultation bilaterally, no wheezing, rhonchi or rales Abd: soft, nontender, no hepatomegaly Ext: bilateral trace pitting pedal edema Skin: warm and dry Neuro:  CNs 2-12 intact, no focal abnormalities noted

## 2013-04-27 NOTE — Assessment & Plan Note (Addendum)
Will provide with coupon and refill of Cialis.

## 2013-05-01 ENCOUNTER — Telehealth: Payer: Self-pay

## 2013-05-01 ENCOUNTER — Telehealth: Payer: Self-pay | Admitting: Physician Assistant

## 2013-05-01 NOTE — Telephone Encounter (Signed)
Error

## 2013-05-01 NOTE — Addendum Note (Signed)
Addended by: Sabino Snipes E on: 05/01/2013 03:04 PM   Modules accepted: Orders

## 2013-05-01 NOTE — Telephone Encounter (Signed)
Pt wants to know when he is supposed to start taking the new medication hydraalazine. Please call

## 2013-05-01 NOTE — Telephone Encounter (Signed)
I explained to pt, per [name], [place] last note, he should start hydralazine as soon as he d/c norvasc. The hydralazine was to replace the norvasc. Understanding verb

## 2013-05-19 ENCOUNTER — Ambulatory Visit (INDEPENDENT_AMBULATORY_CARE_PROVIDER_SITE_OTHER): Payer: BC Managed Care – PPO

## 2013-05-19 ENCOUNTER — Other Ambulatory Visit: Payer: Self-pay

## 2013-05-19 ENCOUNTER — Other Ambulatory Visit (INDEPENDENT_AMBULATORY_CARE_PROVIDER_SITE_OTHER): Payer: BC Managed Care – PPO

## 2013-05-19 VITALS — BP 160/96 | HR 69 | Ht 72.5 in | Wt 276.2 lb

## 2013-05-19 DIAGNOSIS — I119 Hypertensive heart disease without heart failure: Secondary | ICD-10-CM

## 2013-05-19 DIAGNOSIS — I10 Essential (primary) hypertension: Secondary | ICD-10-CM

## 2013-05-19 DIAGNOSIS — N529 Male erectile dysfunction, unspecified: Secondary | ICD-10-CM

## 2013-05-19 DIAGNOSIS — E785 Hyperlipidemia, unspecified: Secondary | ICD-10-CM

## 2013-05-19 DIAGNOSIS — R600 Localized edema: Secondary | ICD-10-CM

## 2013-05-19 NOTE — Progress Notes (Signed)
Patient came in for an Echo today and was discussing his blood pressure running high with a headache for two days. He was switched from norvasc to hydralazine about two weeks ago and he feels that BP running higher now than when he was on the norvasc. I spoke with Melissa about the patient and she instructed the patient to take an extra hydralazine when he gets home and that we will send the message to [place], [place] or Dr. Mariah Milling. Please advise.

## 2013-05-19 NOTE — Progress Notes (Signed)
Agree. Please advise to increase hydralazine to 50mg  (two tablets) PO TID starting tonight. Thanks.   Jacqulyn Bath, PA-C 05/19/2013 3:26 PM

## 2013-05-20 ENCOUNTER — Telehealth: Payer: Self-pay

## 2013-05-20 NOTE — Telephone Encounter (Signed)
Pt states he is waiting for a call about his medication. Also would like to know if echo results are back.Please advise

## 2013-05-20 NOTE — Telephone Encounter (Signed)
See clinical correspondence note from 6/3

## 2013-05-20 NOTE — Progress Notes (Signed)
Notified patient per [name], [place] need to try increasing the Hydralazine 25 mg to three times a day. The patient will monitor blood pressure and let us know if still elevated.

## 2013-06-02 ENCOUNTER — Encounter: Payer: Self-pay | Admitting: Cardiovascular Disease

## 2013-06-02 ENCOUNTER — Ambulatory Visit (INDEPENDENT_AMBULATORY_CARE_PROVIDER_SITE_OTHER): Payer: BC Managed Care – PPO | Admitting: Cardiovascular Disease

## 2013-06-02 VITALS — BP 142/86 | HR 63 | Ht 73.0 in | Wt 280.0 lb

## 2013-06-02 DIAGNOSIS — I429 Cardiomyopathy, unspecified: Secondary | ICD-10-CM

## 2013-06-02 DIAGNOSIS — R600 Localized edema: Secondary | ICD-10-CM

## 2013-06-02 DIAGNOSIS — I428 Other cardiomyopathies: Secondary | ICD-10-CM

## 2013-06-02 DIAGNOSIS — I420 Dilated cardiomyopathy: Secondary | ICD-10-CM | POA: Insufficient documentation

## 2013-06-02 DIAGNOSIS — R609 Edema, unspecified: Secondary | ICD-10-CM

## 2013-06-02 DIAGNOSIS — E785 Hyperlipidemia, unspecified: Secondary | ICD-10-CM

## 2013-06-02 DIAGNOSIS — E669 Obesity, unspecified: Secondary | ICD-10-CM

## 2013-06-02 DIAGNOSIS — I10 Essential (primary) hypertension: Secondary | ICD-10-CM

## 2013-06-02 NOTE — Assessment & Plan Note (Signed)
Ejection fraction 35-40% on recent echocardiogram. Worsening shortness of breath. We have discussed the various treatment options with him. Unable to exclude ischemia as a cause of his wall motion abnormality on echo and decrease in ejection fraction. Certainly high risk for coronary artery disease even untreated hyperlipidemia, obesity, male. He would prefer cardiac catheterization to rule out underlying coronary artery disease. We have suggested he stop drinking as this can cause cardiopathy. May need followup with sleep physician for his obstructive sleep apnea.

## 2013-06-02 NOTE — Assessment & Plan Note (Signed)
Will readdress his high cholesterol after his cardiac catheterization.

## 2013-06-02 NOTE — Patient Instructions (Addendum)
For new changes on your echo and shortness of breath, We will schedule you for a cardiac cath on July 10th   No food the morning of the cardiac cath Hold your HCTZ    Please call us if you have new issues that need to be addressed before your next appt.  Your physician wants you to follow-up in: 4 to 5  weeks

## 2013-06-02 NOTE — Assessment & Plan Note (Signed)
We have encouraged continued exercise, careful diet management in an effort to lose weight. 

## 2013-06-02 NOTE — Assessment & Plan Note (Signed)
His edema has improved on today's visit. Continue to hold amlodipine. Cardiac catheterization pending.

## 2013-06-02 NOTE — Assessment & Plan Note (Signed)
Blood pressure is well controlled on today's visit. No changes made to the medications. 

## 2013-06-02 NOTE — Progress Notes (Signed)
Patient ID: Casey Ringer., male    DOB: 1956/02/20, 57 y.o.   MRN: [id number]  HPI Comments: Casey Bankhead. is a 57 y.o. male with PMHx s/f HTN, HLD, significant alcohol history drinking at least 6 beers per day , erectile dysfunction, OSA who wears CPAP , and obesity who presents today for follow-up.   Previous stress test in 2012 which revealed no evidenced of ischemia, but reduced EF.  A follow-up 2D echo indicated EF 50-55% with mildly dilated LV and mild LVH, otherwise normal study.   Echocardiogram done recently for lower extremity edema showed depressed ejection fraction 35-40% with challenging images, inferior posterior wall hypokinesis.   He states that he continues to drink and be 6 beers per day, sometimes more.  He has been compliant with the CPAP but has not checked his machine in several years.  On his last visit, amlodipine was held and his edema in the legs has improved.  He has noticed worsening shortness of breath, particularly with exertion. Weight continues to be a major issue. He has tried to watch his diet and be better but weight has not improved.   . He has been on Lipitor in the past, but was unable to tolerate due to myalgias. ED improved on Cialis.  EKG today reveals NSR, LBBB (chronic)     Outpatient Encounter Prescriptions as of 06/02/2013  Medication Sig Dispense Refill  . carvedilol (COREG) 6.25 MG tablet TAKE 1 TABLET (6.25 MG TOTAL) BY MOUTH 2 (TWO) TIMES DAILY.  180 tablet  3  . Cyanocobalamin (VITAMIN B-12 PO) Take by mouth daily.      . hydrALAZINE (APRESOLINE) 25 MG tablet Take 25 mg by mouth 3 (three) times daily.      Marland Kitchen losartan-hydrochlorothiazide (HYZAAR) 100-25 MG per tablet TAKE 1 TABLET EVERY DAY  90 tablet  1  . naproxen sodium (ANAPROX) 220 MG tablet Take 220 mg by mouth daily as needed.      . Omega-3 Fatty Acids (FISH OIL) 1200 MG CAPS Take by mouth daily. Takes 3 tablets daily      . tadalafil (CIALIS) 20 MG tablet Take 1 tablet  (20 mg total) by mouth as needed for erectile dysfunction.  6 tablet  0    Review of Systems  Constitutional: Negative.   HENT: Negative.   Eyes: Negative.   Respiratory: Negative.   Cardiovascular: Negative.   Gastrointestinal: Negative.   Musculoskeletal: Negative.   Skin: Negative.   Neurological: Negative.   Psychiatric/Behavioral: Negative.   All other systems reviewed and are negative.    BP 142/86  Pulse 63  Ht 6\' 1"  (1.854 m)  Wt 280 lb (127.007 kg)  BMI 36.95 kg/m2  Physical Exam  Nursing note and vitals reviewed. Constitutional: He is oriented to person, place, and time. He appears well-developed and well-nourished.  HENT:  Head: Normocephalic.  Nose: Nose normal.  Mouth/Throat: Oropharynx is clear and moist.  Eyes: Conjunctivae are normal. Pupils are equal, round, and reactive to light.  Neck: Normal range of motion. Neck supple. No JVD present.  Cardiovascular: Normal rate, regular rhythm, S1 normal, S2 normal, normal heart sounds and intact distal pulses.  Exam reveals no gallop and no friction rub.   No murmur heard. Pulmonary/Chest: Effort normal and breath sounds normal. No respiratory distress. He has no wheezes. He has no rales. He exhibits no tenderness.  Abdominal: Soft. Bowel sounds are normal. He exhibits no distension. There is no tenderness.  Musculoskeletal: Normal  range of motion. He exhibits no edema and no tenderness.  Lymphadenopathy:    He has no cervical adenopathy.  Neurological: He is alert and oriented to person, place, and time. Coordination normal.  Skin: Skin is warm and dry. No rash noted. No erythema.  Psychiatric: He has a normal mood and affect. His behavior is normal. Judgment and thought content normal.      Assessment and Plan

## 2013-06-10 ENCOUNTER — Other Ambulatory Visit: Payer: Self-pay | Admitting: Physician Assistant

## 2013-06-11 NOTE — Telephone Encounter (Signed)
Do you want me to refill? [name], PA gave two tablets with no refills.

## 2013-06-12 ENCOUNTER — Other Ambulatory Visit: Payer: Self-pay

## 2013-06-12 MED ORDER — TADALAFIL 20 MG PO TABS: 20.0000 mg | ORAL_TABLET | ORAL | Status: DC | PRN

## 2013-06-12 NOTE — Telephone Encounter (Signed)
Please review for refill on Cialis.

## 2013-06-12 NOTE — Telephone Encounter (Signed)
That's fine. He had issues with affordability last time and we provided a coupon. Not sure if the coupon worked if he only obtained two tablets. We could look into this and see if we have any in the office that could reduce the price.       Thanks,      [name]         ----- Message -----   From: Festus Aloe, CMA   Sent: 06/12/2013 7:38 AM   To: Gery Pray, PA-C            ----- Message -----   From: Jarvis Newcomer, CMA   Sent: 06/11/2013 7:19 AM   To: Festus Aloe, CMA      The demographic information from the pharmacy is:   Patient Name: [facility]   Patient DOB: Oct 22, 1956   Patient Gender: Male   Address:   [address]   [place]   [place]   [address]                       Medications     Pending     Disp Refills Start End   CIALIS 20 MG tablet [Pharmacy Med Name: CIALIS 20MG  TABLETS] 2 tablet 0 06/10/2013    Sig: TAKE 1 TABLET AS NEEDED   DAW: No         Call Documentation    Festus Aloe, CMA at 06/12/2013 7:38 AM    Status: Signed             Please review for refill on Cialis.        Festus Aloe, CMA at 06/11/2013 10:50 AM    Status: Signed             Do you want me to refill? [name], PA gave two tablets with no refills.           Refill sent for cialis 6 tablets with no refills.

## 2013-06-24 ENCOUNTER — Ambulatory Visit: Payer: Self-pay | Admitting: Cardiovascular Disease

## 2013-06-24 ENCOUNTER — Ambulatory Visit (INDEPENDENT_AMBULATORY_CARE_PROVIDER_SITE_OTHER): Payer: BC Managed Care – PPO

## 2013-06-24 DIAGNOSIS — I428 Other cardiomyopathies: Secondary | ICD-10-CM

## 2013-06-24 DIAGNOSIS — I429 Cardiomyopathy, unspecified: Secondary | ICD-10-CM

## 2013-06-24 LAB — CBC WITH DIFFERENTIAL
Eos: 3 % (ref 0–5)
HCT: 40.1 % (ref 37.5–51.0)
Lymphocytes Absolute: 1.4 10*3/uL (ref 0.7–3.1)
MCH: 30.4 pg (ref 26.6–33.0)
MCV: 90 fL (ref 79–97)
Monocytes Absolute: 0.7 10*3/uL (ref 0.1–0.9)
Monocytes: 11 % (ref 4–12)
Neutrophils Absolute: 3.8 10*3/uL (ref 1.4–7.0)
RDW: 13.2 % (ref 12.3–15.4)
WBC: 6.2 10*3/uL (ref 3.4–10.8)

## 2013-06-24 LAB — BASIC METABOLIC PANEL
BUN: 20 mg/dL (ref 6–24)
Calcium: 9.2 mg/dL (ref 8.7–10.2)
Chloride: 101 mmol/L (ref 97–108)
GFR calc Af Amer: 101 mL/min/{1.73_m2} (ref >59)
Glucose: 115 mg/dL — ABNORMAL HIGH (ref 65–99)

## 2013-06-25 ENCOUNTER — Ambulatory Visit: Payer: Self-pay | Admitting: Cardiovascular Disease

## 2013-06-25 DIAGNOSIS — R079 Chest pain, unspecified: Secondary | ICD-10-CM

## 2013-06-25 HISTORY — PX: CARDIAC CATHETERIZATION: SHX172

## 2013-06-29 ENCOUNTER — Encounter: Payer: Self-pay | Admitting: *Deleted

## 2013-07-03 ENCOUNTER — Ambulatory Visit: Payer: BC Managed Care – PPO | Admitting: Cardiovascular Disease

## 2013-07-07 ENCOUNTER — Ambulatory Visit (INDEPENDENT_AMBULATORY_CARE_PROVIDER_SITE_OTHER): Payer: BC Managed Care – PPO | Admitting: Physician Assistant

## 2013-07-07 ENCOUNTER — Encounter: Payer: Self-pay | Admitting: Physician Assistant

## 2013-07-07 ENCOUNTER — Other Ambulatory Visit: Payer: Self-pay | Admitting: *Deleted

## 2013-07-07 VITALS — BP 138/90 | HR 74 | Ht 73.0 in | Wt 279.8 lb

## 2013-07-07 DIAGNOSIS — I420 Dilated cardiomyopathy: Secondary | ICD-10-CM

## 2013-07-07 DIAGNOSIS — E669 Obesity, unspecified: Secondary | ICD-10-CM

## 2013-07-07 DIAGNOSIS — Z9989 Dependence on other enabling machines and devices: Secondary | ICD-10-CM | POA: Insufficient documentation

## 2013-07-07 DIAGNOSIS — I428 Other cardiomyopathies: Secondary | ICD-10-CM

## 2013-07-07 DIAGNOSIS — F17200 Nicotine dependence, unspecified, uncomplicated: Secondary | ICD-10-CM

## 2013-07-07 DIAGNOSIS — E785 Hyperlipidemia, unspecified: Secondary | ICD-10-CM

## 2013-07-07 DIAGNOSIS — R635 Abnormal weight gain: Secondary | ICD-10-CM

## 2013-07-07 DIAGNOSIS — I10 Essential (primary) hypertension: Secondary | ICD-10-CM

## 2013-07-07 DIAGNOSIS — Z720 Tobacco use: Secondary | ICD-10-CM | POA: Insufficient documentation

## 2013-07-07 DIAGNOSIS — G4733 Obstructive sleep apnea (adult) (pediatric): Secondary | ICD-10-CM

## 2013-07-07 DIAGNOSIS — R0609 Other forms of dyspnea: Secondary | ICD-10-CM

## 2013-07-07 DIAGNOSIS — F1020 Alcohol dependence, uncomplicated: Secondary | ICD-10-CM | POA: Insufficient documentation

## 2013-07-07 MED ORDER — ASPIRIN EC 81 MG PO TBEC: 81.0000 mg | DELAYED_RELEASE_TABLET | Freq: Every day | ORAL | Status: DC

## 2013-07-07 NOTE — Assessment & Plan Note (Signed)
Will arrange outpatient sleep study and forward to PCP to redefine/adjust CPAP parameters.

## 2013-07-07 NOTE — Patient Instructions (Signed)
We will schedule an outpatient sleep study and call with the appointment date and time. Will forward results to Dr. Jeanie Sewer with [facility] [place].   Please stop alcohol consumption.   Please modify your diet and continue exercise. You may follow-up with a nutritionist for further advise.   We will check lab work today and call with the results.   Please stop Aleve or take only as needed. Start low dose aspirin.

## 2013-07-07 NOTE — Assessment & Plan Note (Signed)
Stressed diet modification and exercise. Discussed how EtOH cessation would help with this. He will call a nutritionist for further assistance if needed.

## 2013-07-07 NOTE — Assessment & Plan Note (Signed)
Chews 1 can/day x 30 years. Discussed replacing with nicotine gum and weaning from there. He was open to trying this strategy.

## 2013-07-07 NOTE — Assessment & Plan Note (Signed)
Statin intolerant. Continue fish oil

## 2013-07-07 NOTE — Progress Notes (Signed)
Patient ID: Casey Ringer., male   DOB: 02/21/1956, 57 y.o.   MRN: [id number]           Date:  07/07/2013   ID:  Casey Ringer., DOB May 01, 1956, MRN [id number]  PCP:  Noni Saupe., MD  Primary Cardiologist:  Concha Se, MD  History of Present Illness: Casey Wieczorek. is a 57 y.o. male with PMHx s/f nonischemic cardiomyopathy (EF 35-40%), chronic LBBB, HTN, HLD, obesity, EtOH abuse (6 beers/day), erectile dysfunction, OSA (on CPAP) and obesity who presents today for post-cath follow-up.   He followed up with Dr. Mariah Milling 06/12/13 at which time he complained of worsening dyspnea on exertion. He does have underlying OSA and deconditioning/obesity/OHS. CPAP machine has not be checked in several years. He reported that he continued to drink and be 6 beers per day, sometimes more. Weight gain continues to be an ongoing issue. He had c/o LE edema 04/2013 at which time Norvasc was stopped.  Echocardiogram 05/2013 for lower extremity edema showed depressed ejection fraction 35-40% with challenging images, inferior posterior wall HK.   Cardiac cath was arranged and performed on 06/25/13 revealing minor luminal irregularities, EF 45-50%, mild inferior wall HK. EtOH cessation and CPAP compliance was recommended.   He has been on Lipitor in the past, but was unable to tolerate due to myalgias. ED improved on Cialis.   He feels well today. He is able to complete 1 hour on the stationary bike and climb 3 flights of stairs without incident. He denies palpitations, syncope, lightheadedness, chest pain, LE edema, PND or orthopnea. He has cut his drinking down, but still indulges at barbecues and on the weekends. He chews tobacco (1 can/day). He reports losing "a few pounds" but knows he is still overweight. His goal is ~ 200 lbs. He has chronic R knee pain for which he takes Aleve daily. No history of thyroid issues.    EKG: NSR, 74 bpm, LBBB, isolated PVC   Wt Readings from Last 3 Encounters:    07/07/13 126.916 kg (279 lb 12.8 oz)  06/02/13 127.007 kg (280 lb)  05/19/13 125.306 kg (276 lb 4 oz)     Past Medical History  Diagnosis Date  . Obstructive sleep apnea   . Obesity   . Hypertension   . Injury by electrocution   . Injury of shoulder   . Hyperlipidemia     Current Outpatient Prescriptions  Medication Sig Dispense Refill  . carvedilol (COREG) 6.25 MG tablet TAKE 1 TABLET (6.25 MG TOTAL) BY MOUTH 2 (TWO) TIMES DAILY.  180 tablet  3  . Cyanocobalamin (VITAMIN B-12 PO) Take by mouth daily.      . hydrALAZINE (APRESOLINE) 25 MG tablet Take 25 mg by mouth 3 (three) times daily.      Marland Kitchen losartan-hydrochlorothiazide (HYZAAR) 100-25 MG per tablet TAKE 1 TABLET EVERY DAY  90 tablet  1  . naproxen sodium (ANAPROX) 220 MG tablet Take 220 mg by mouth daily as needed.      . Omega-3 Fatty Acids (FISH OIL) 1200 MG CAPS Take by mouth daily. Takes 3 tablets daily      . tadalafil (CIALIS) 20 MG tablet Take 1 tablet (20 mg total) by mouth as needed for erectile dysfunction.  6 tablet  0   No current facility-administered medications for this visit.    Allergies:   No Known Allergies  Social History:  The patient  reports that he has never smoked. His smokeless tobacco use  includes Snuff. He reports that  drinks alcohol. He reports that he does not use illicit drugs.   Family History: Denies family history of cardiac problems.   Review of Systems: General:  negative for chills, fever, night sweats or weight changes.  Cardiovascular: positive for dyspnea on exertion, negative for chest pain, edema, orthopnea, palpitations, paroxysmal nocturnal dyspnea Dermatological: negative for rash Respiratory: negative for cough or wheezing Urologic: negative for hematuria Abdominal: negative for nausea, vomiting, diarrhea, bright red blood per rectum, melena, or hematemesis Neurologic: negative for visual changes, syncope, or dizziness All other systems reviewed and are otherwise negative  except as noted above.  PHYSICAL EXAM: VS:  BP 138/90  Pulse 74  Ht 6\' 1"  (1.854 m)  Wt 126.916 kg (279 lb 12.8 oz)  BMI 36.92 kg/m2 Obese appearing Caucasian male in NAD HEENT: normal, PERRL Neck: no JVD or bruits Cardiac:  normal S1, S2; RRR; no murmur or gallops Lungs:  clear to auscultation bilaterally, no wheezing, rhonchi or rales Abd: soft, nontender, central adiposity, no hepatomegaly, normoactive BS x 4 quads Ext: no edema, cyanosis or clubbing Skin: warm and dry, cap refill < 2 sec Neuro:  CNs 2-12 intact, no focal abnormalities noted Musculoskeletal: strength and tone appropriate for age  Psych: normal affect

## 2013-07-07 NOTE — Assessment & Plan Note (Signed)
Well-controlled today. Continue ARB/BB and HCTZ. Again, weight loss will aide with BP control.

## 2013-07-07 NOTE — Assessment & Plan Note (Signed)
Advised on EtOH cessation. He is able to quit by himself and declined resources for assistance.

## 2013-07-07 NOTE — Assessment & Plan Note (Signed)
EF 35-40% on 05/2013 echo, 45-50% on cath 06/25/2013. Reports stable DOE. He is able to climb 3 flights of stairs or ride an elliptical bike for 1 hour at moderate intensity. Would classify as NYHA I symptoms. Euvolemic on exam today. Differential etiologies include OSA, obesity/OHS and alcoholic CM. Management will be focused on EtOH cessation which will facilitate weight reduction and hence improvement in sleep apnea. Have advised to cut back on drinking. Will arrange outpatient sleep study to determine CPAP settings as these have not been recalibrated in several years. Will forward to PCP. He took it upon himself to try losing weight, but will consult a nutritionist for assistance. Check TSH, free T4 to exclude metabolic sources. Continue ARB/BB/thiazide diuretic therapy. Should EF remain reduced despite weight loss, EtOH cessation and improvement in OSA, consider PVC induced cardiomyopathy or chronic dyssynchrony and assess ectopy burden with Holter monitoring.

## 2013-07-08 ENCOUNTER — Encounter: Payer: Self-pay | Admitting: *Deleted

## 2013-07-16 ENCOUNTER — Other Ambulatory Visit: Payer: Self-pay

## 2013-07-16 DIAGNOSIS — G4730 Sleep apnea, unspecified: Secondary | ICD-10-CM

## 2013-07-16 DIAGNOSIS — I10 Essential (primary) hypertension: Secondary | ICD-10-CM

## 2013-07-28 ENCOUNTER — Ambulatory Visit: Payer: Self-pay | Admitting: Cardiovascular Disease

## 2013-08-08 ENCOUNTER — Other Ambulatory Visit: Payer: Self-pay | Admitting: Physician Assistant

## 2013-08-09 ENCOUNTER — Other Ambulatory Visit: Payer: Self-pay | Admitting: Physician Assistant

## 2013-08-10 ENCOUNTER — Telehealth: Payer: Self-pay

## 2013-08-10 ENCOUNTER — Other Ambulatory Visit: Payer: Self-pay | Admitting: *Deleted

## 2013-08-10 MED ORDER — TADALAFIL 20 MG PO TABS: 20.0000 mg | ORAL_TABLET | ORAL | Status: DC | PRN

## 2013-08-10 NOTE — Telephone Encounter (Signed)
Pt would like sleep study results.please call

## 2013-08-10 NOTE — Telephone Encounter (Signed)
Sent to Sharon

## 2013-08-10 NOTE — Telephone Encounter (Signed)
Hi Sharon so good to see you today,  I am sending you some of the results back for Mandy to look at. These are ones that I did not get to.  Thank you Carol  

## 2013-08-10 NOTE — Telephone Encounter (Signed)
Refill Cialis #6 R#3 sent to [facility] pharmacy.

## 2013-08-12 ENCOUNTER — Telehealth: Payer: Self-pay

## 2013-08-12 NOTE — Telephone Encounter (Signed)
Pt called wanted results of sleep study from 07/28/13, states that he has called several times and "nobody will tell him anything".  Explained that Dr. Mariah Milling is out of town for the rest of this week.  Pt asked of [name], [place] could read over sleep study, who advised pt that he has severe OSA without his CPAP and should f/u with his PCP re having his CPAP recalibrated.  Pt states that his CPAP is over 58 yrs old and he would like a new machine.  Previously used [place], but would like to use a company here in [place].  Would appreciate hearing the full interpretation of his sleep study as soon as Dr. Mariah Milling returns to the office.

## 2013-08-12 NOTE — Telephone Encounter (Signed)
Sleep study results are in Dr. Windell Hummingbird folder for review. Patient notified regarding this.

## 2013-08-14 NOTE — Telephone Encounter (Signed)
I am currently out of the office, report not scanned in Report per [name] suggests severe OSA.  Otions include we get him to a new sleep lab in  for part 2 of the sleep study (CPAP titration) or he see a sleep specialist (pulmonary dept).  Some have tried an "auto titration" machine that does it for you, skip part 2 of the study  We can help with whatever he would prefer.

## 2013-08-18 NOTE — Telephone Encounter (Signed)
Faxed order to [facility] 08/18/13.

## 2013-08-18 NOTE — Telephone Encounter (Signed)
Spoke w/ pt.  He states that he "already had the whole test" and was told that he did not need to come back.  His main concern now is getting a new machine and a new mask.  He would like to use the "lady near our office to make it more convenient".  Please let me know where I need to refer him in order to get a new CPAP machine.  Thank you!

## 2013-08-28 ENCOUNTER — Encounter: Payer: Self-pay | Admitting: Cardiovascular Disease

## 2013-09-04 ENCOUNTER — Telehealth: Payer: Self-pay

## 2013-09-04 NOTE — Telephone Encounter (Signed)
Faxed rx for cmn to [facility] for durable medical equipment and ancillary supplies: CPAP at 14cm H20,  Heated humidifier, Full face mask, cushion, headgear, tubing & water chamber.

## 2013-09-07 ENCOUNTER — Other Ambulatory Visit: Payer: Self-pay | Admitting: Cardiovascular Disease

## 2013-09-14 ENCOUNTER — Other Ambulatory Visit: Payer: Self-pay | Admitting: Cardiovascular Disease

## 2013-09-15 ENCOUNTER — Other Ambulatory Visit: Payer: Self-pay | Admitting: *Deleted

## 2013-09-15 MED ORDER — CARVEDILOL 6.25 MG PO TABS: ORAL_TABLET | ORAL | Status: DC

## 2013-09-15 NOTE — Telephone Encounter (Signed)
Refilled Carvedilol sent to [facility]. Meds reviewed verbally with pt.

## 2013-09-21 ENCOUNTER — Other Ambulatory Visit: Payer: Self-pay

## 2013-09-21 DIAGNOSIS — N529 Male erectile dysfunction, unspecified: Secondary | ICD-10-CM

## 2013-09-21 MED ORDER — TADALAFIL 20 MG PO TABS: 20.0000 mg | ORAL_TABLET | ORAL | Status: DC | PRN

## 2014-01-04 ENCOUNTER — Other Ambulatory Visit: Payer: Self-pay | Admitting: *Deleted

## 2014-01-04 DIAGNOSIS — N529 Male erectile dysfunction, unspecified: Secondary | ICD-10-CM

## 2014-01-04 NOTE — Telephone Encounter (Signed)
Patient wants 4 to 6 pills a month

## 2014-01-05 MED ORDER — TADALAFIL 20 MG PO TABS: 20.0000 mg | ORAL_TABLET | ORAL | Status: DC | PRN

## 2014-01-11 ENCOUNTER — Other Ambulatory Visit: Payer: Self-pay

## 2014-01-11 DIAGNOSIS — N529 Male erectile dysfunction, unspecified: Secondary | ICD-10-CM

## 2014-01-11 MED ORDER — TADALAFIL 20 MG PO TABS: 20.0000 mg | ORAL_TABLET | ORAL | Status: DC | PRN

## 2014-01-11 NOTE — Telephone Encounter (Signed)
Cialis 20 mg sent to pharmacy.

## 2014-01-26 ENCOUNTER — Telehealth: Payer: Self-pay

## 2014-01-26 NOTE — Telephone Encounter (Signed)
Spoke w/ pt.  He reports that he had CPE recently and his PCP expressed concerned about his weight. Pt's PCP recommended that pt start a diet pill if cleared by Dr. Mariah Milling.  Unfortunately, pt does not remember the name of the medication.  He believes it is Sensa, but he will clarify and email me the name of the medication.

## 2014-01-26 NOTE — Telephone Encounter (Signed)
Pt would like a call back has a question regarding a weight loss pill. Please call.

## 2014-01-27 NOTE — Telephone Encounter (Signed)
Jimmy [email or web address]  Wed 01/27/2014 3:21 PM  To:  Rhea Belton;  The medicine Dr Jeanie Sewer asked about is phentermine

## 2014-01-27 NOTE — Telephone Encounter (Signed)
He has not had any significant cardiac arrhythmia issues No known CAD As far as I'm concerned, he could try the phentermine Was stopped the medication for any side effects Would definitely cut back on his alcohol intake

## 2014-01-28 NOTE — Telephone Encounter (Signed)
Spoke w/ pt.  Advised him of Dr. Windell Hummingbird recommendation. He will contact Dr. Jeanie Sewer for this rx and call w/ any other questions or concerns.

## 2014-04-05 ENCOUNTER — Telehealth: Payer: Self-pay

## 2014-04-05 ENCOUNTER — Other Ambulatory Visit: Payer: Self-pay | Admitting: *Deleted

## 2014-04-05 DIAGNOSIS — N529 Male erectile dysfunction, unspecified: Secondary | ICD-10-CM

## 2014-04-05 MED ORDER — TADALAFIL 20 MG PO TABS: 20.0000 mg | ORAL_TABLET | ORAL | Status: DC | PRN

## 2014-04-05 NOTE — Telephone Encounter (Signed)
Cialis needs prior [employer] [phone]

## 2014-04-05 NOTE — Telephone Encounter (Signed)
Requested Prescriptions   Signed Prescriptions Disp Refills  . tadalafil (CIALIS) 20 MG tablet 4 tablet 3    Sig: Take 1 tablet (20 mg total) by mouth as needed for erectile dysfunction.    Authorizing Provider: Antonieta Iba    Ordering User: Kendrick Fries

## 2014-04-06 NOTE — Telephone Encounter (Signed)
Spoke with Casey Hess from [place] prior authorization for cialis. Casey Hess will fax a form for [name] to feel out for the prior authorization.

## 2014-04-07 NOTE — Telephone Encounter (Signed)
Waiting for a signature from Dr. Mariah Milling.

## 2014-04-09 NOTE — Telephone Encounter (Signed)
Faxed prior auth form to [facility]; waiting for approval.

## 2014-05-24 ENCOUNTER — Other Ambulatory Visit: Payer: Self-pay

## 2014-05-24 MED ORDER — HYDRALAZINE HCL 25 MG PO TABS: 25.0000 mg | ORAL_TABLET | Freq: Three times a day (TID) | ORAL | Status: DC

## 2014-06-29 ENCOUNTER — Other Ambulatory Visit: Payer: Self-pay | Admitting: Cardiovascular Disease

## 2014-07-07 ENCOUNTER — Telehealth: Payer: Self-pay

## 2014-07-07 ENCOUNTER — Ambulatory Visit (INDEPENDENT_AMBULATORY_CARE_PROVIDER_SITE_OTHER): Payer: BC Managed Care – PPO | Admitting: Cardiovascular Disease

## 2014-07-07 ENCOUNTER — Encounter: Payer: Self-pay | Admitting: Cardiovascular Disease

## 2014-07-07 VITALS — BP 155/90 | HR 59 | Ht 73.0 in | Wt 275.0 lb

## 2014-07-07 DIAGNOSIS — Z9989 Dependence on other enabling machines and devices: Secondary | ICD-10-CM

## 2014-07-07 DIAGNOSIS — N521 Erectile dysfunction due to diseases classified elsewhere: Secondary | ICD-10-CM

## 2014-07-07 DIAGNOSIS — I420 Dilated cardiomyopathy: Secondary | ICD-10-CM

## 2014-07-07 DIAGNOSIS — I428 Other cardiomyopathies: Secondary | ICD-10-CM

## 2014-07-07 DIAGNOSIS — R0602 Shortness of breath: Secondary | ICD-10-CM

## 2014-07-07 DIAGNOSIS — N529 Male erectile dysfunction, unspecified: Secondary | ICD-10-CM

## 2014-07-07 DIAGNOSIS — G4733 Obstructive sleep apnea (adult) (pediatric): Secondary | ICD-10-CM

## 2014-07-07 DIAGNOSIS — I10 Essential (primary) hypertension: Secondary | ICD-10-CM

## 2014-07-07 DIAGNOSIS — E669 Obesity, unspecified: Secondary | ICD-10-CM

## 2014-07-07 MED ORDER — TADALAFIL 5 MG PO TABS: 5.0000 mg | ORAL_TABLET | Freq: Every day | ORAL | Status: DC | PRN

## 2014-07-07 MED ORDER — LOSARTAN POTASSIUM-HCTZ 100-25 MG PO TABS: 1.0000 | ORAL_TABLET | Freq: Every day | ORAL | Status: DC

## 2014-07-07 MED ORDER — CARVEDILOL 6.25 MG PO TABS: 6.2500 mg | ORAL_TABLET | Freq: Two times a day (BID) | ORAL | Status: DC

## 2014-07-07 MED ORDER — AMLODIPINE BESYLATE 10 MG PO TABS: 10.0000 mg | ORAL_TABLET | Freq: Every day | ORAL | Status: DC

## 2014-07-07 NOTE — Progress Notes (Signed)
Patient ID: Casey Hess., male    DOB: 1956-02-23, 58 y.o.   MRN: [id number]  HPI Comments: Casey Hess. is a 58 y.o. male with PMHx s/f HTN, HLD, significant alcohol history drinking at least 6 beers per day , erectile dysfunction, OSA who wears CPAP , and obesity who presents today for follow-up.   In followup today, he reports that his blood pressures typically well controlled at home. He denies having any new symptoms. No lower extremity edema. He is using his CPAP for his obstructive sleep apnea with no symptoms, sleeping well Is taking Coreg, hydralazine, losartan HCTZ. Sometimes misses his hydralazine  Previous stress test in 2012 which revealed no evidenced of ischemia, but reduced EF.  A follow-up 2D echo indicated EF 50-55% with mildly dilated LV and mild LVH, otherwise normal study.   Echocardiogram done recently for lower extremity edema showed depressed ejection fraction 35-40% with challenging images, inferior posterior wall hypokinesis.   continues to drink a significant amount of beer Weight continues to be a major issue. He has tried to watch his diet and be better but weight has not improved.   He has been on Lipitor in the past, but was unable to tolerate due to myalgias. ED improved on Cialis.  EKG today reveals NSR, LBBB (chronic) , heart rate 59 beats per minute    Outpatient Encounter Prescriptions as of 07/07/2014  Medication Sig  . aspirin EC 81 MG tablet Take 1 tablet (81 mg total) by mouth daily.  . carvedilol (COREG) 6.25 MG tablet TAKE 1 TABLET BY MOUTH TWICE DAILY  . Cyanocobalamin (VITAMIN B-12 PO) Take by mouth daily.  . hydrALAZINE (APRESOLINE) 25 MG tablet Take 1 tablet (25 mg total) by mouth 3 (three) times daily.  Marland Kitchen losartan-hydrochlorothiazide (HYZAAR) 100-25 MG per tablet TAKE 1 TABLET BY MOUTH ONCE DAILY  . Omega-3 Fatty Acids (FISH OIL) 1200 MG CAPS Take by mouth daily. Takes 3 tablets daily  . tadalafil (CIALIS) 20 MG tablet Take 1  tablet (20 mg total) by mouth as needed for erectile dysfunction.    Review of Systems  Constitutional: Negative.   HENT: Negative.   Eyes: Negative.   Respiratory: Negative.   Cardiovascular: Negative.   Gastrointestinal: Negative.   Endocrine: Negative.   Musculoskeletal: Negative.   Skin: Negative.   Allergic/Immunologic: Negative.   Neurological: Negative.   Hematological: Negative.   Psychiatric/Behavioral: Negative.   All other systems reviewed and are negative.   BP 174/110  Pulse 59  Ht 6\' 1"  (1.854 m)  Wt 275 lb (124.739 kg)  BMI 36.29 kg/m2  Physical Exam  Nursing note and vitals reviewed. Constitutional: He is oriented to person, place, and time. He appears well-developed and well-nourished.  HENT:  Head: Normocephalic.  Nose: Nose normal.  Mouth/Throat: Oropharynx is clear and moist.  Eyes: Conjunctivae are normal. Pupils are equal, round, and reactive to light.  Neck: Normal range of motion. Neck supple. No JVD present.  Cardiovascular: Normal rate, regular rhythm, S1 normal, S2 normal, normal heart sounds and intact distal pulses.  Exam reveals no gallop and no friction rub.   No murmur heard. Pulmonary/Chest: Effort normal and breath sounds normal. No respiratory distress. He has no wheezes. He has no rales. He exhibits no tenderness.  Abdominal: Soft. Bowel sounds are normal. He exhibits no distension. There is no tenderness.  Musculoskeletal: Normal range of motion. He exhibits no edema and no tenderness.  Lymphadenopathy:    He has no  cervical adenopathy.  Neurological: He is alert and oriented to person, place, and time. Coordination normal.  Skin: Skin is warm and dry. No rash noted. No erythema.  Psychiatric: He has a normal mood and affect. His behavior is normal. Judgment and thought content normal.      Assessment and Plan

## 2014-07-07 NOTE — Assessment & Plan Note (Signed)
We have encouraged continued exercise, careful diet management in an effort to lose weight. 

## 2014-07-07 NOTE — Assessment & Plan Note (Signed)
Brief discussion on alcohol and alcohol cessation. We again reiterated the effective alcohol on the heart and cardiomyopathy. Does not appear to have CHF on today's visit

## 2014-07-07 NOTE — Patient Instructions (Addendum)
You are doing well. No medication changes were made.  The pill you were taking for weight is called phentermine  Please call us if you have new issues that need to be addressed before your next appt.  Your physician wants you to follow-up in: 12 months.  You will receive a reminder letter in the mail two months in advance. If you don't receive a letter, please call our office to schedule the follow-up appointment.

## 2014-07-07 NOTE — Assessment & Plan Note (Signed)
Initially we felt we could restart amlodipine but review of old records suggested he had leg swelling and we will not restart the Norvasc. Could stay on hydralazine but he does miss doses. Other option would be to start Cardura or isosorbide. We'll discuss this with him over the phone to see if he would like to switch his hydralazine to something once a day

## 2014-07-07 NOTE — Telephone Encounter (Signed)
With standing Cardura 8 mg daily to take at nighttime Would start one half pill for the first week If no lightheaded symptoms, would increase up to a full pill Monitor her blood pressure to make sure it does not drop low Call us if it continues to run high

## 2014-07-07 NOTE — Telephone Encounter (Signed)
Spoke w/ pt.  Advised him that on review of his chart, Dr. Mariah Milling advises that he not take amlodipine, as he experienced leg edema previously.  Pt reports that he has not picked this rx up. Advised him that he can continue his hydralazine or we can send in another type of med.  Pt would prefer something that he can take once daily, as he works in Aeronautical engineer during the day and finds it difficult to take hydralazine TID.  Please advise.  Thank you.

## 2014-07-07 NOTE — Assessment & Plan Note (Signed)
He is compliant with his CPAP. Reports sleeping well

## 2014-07-08 MED ORDER — DOXAZOSIN MESYLATE 8 MG PO TABS: 8.0000 mg | ORAL_TABLET | Freq: Every day | ORAL | Status: DC

## 2014-07-08 NOTE — Telephone Encounter (Signed)
Spoke w/ pt.  Advised him of Dr. Windell Hummingbird recommendation.  He verbalizes understanding and is agreeable to plan.

## 2014-07-08 NOTE — Telephone Encounter (Signed)
Attempted to contact pt  Voicemail box is full

## 2014-10-03 IMAGING — CR DG CHEST 2V
1 series · 2 of 2 positions shown · non-contrast
Comparison: none

REASON FOR EXAM: chest pain
COMMENTS:

PROCEDURE:     DXR - DXR CHEST PA (OR AP) AND LATERAL  - June 24, 2013  [DATE]
RESULT:     The lungs are clear. The cardiac silhouette and visualized bony
skeleton are unremarkable.

[Series 1: pa · 0.17mm/px · 2 of 2 slices shown]
[im 1/2]
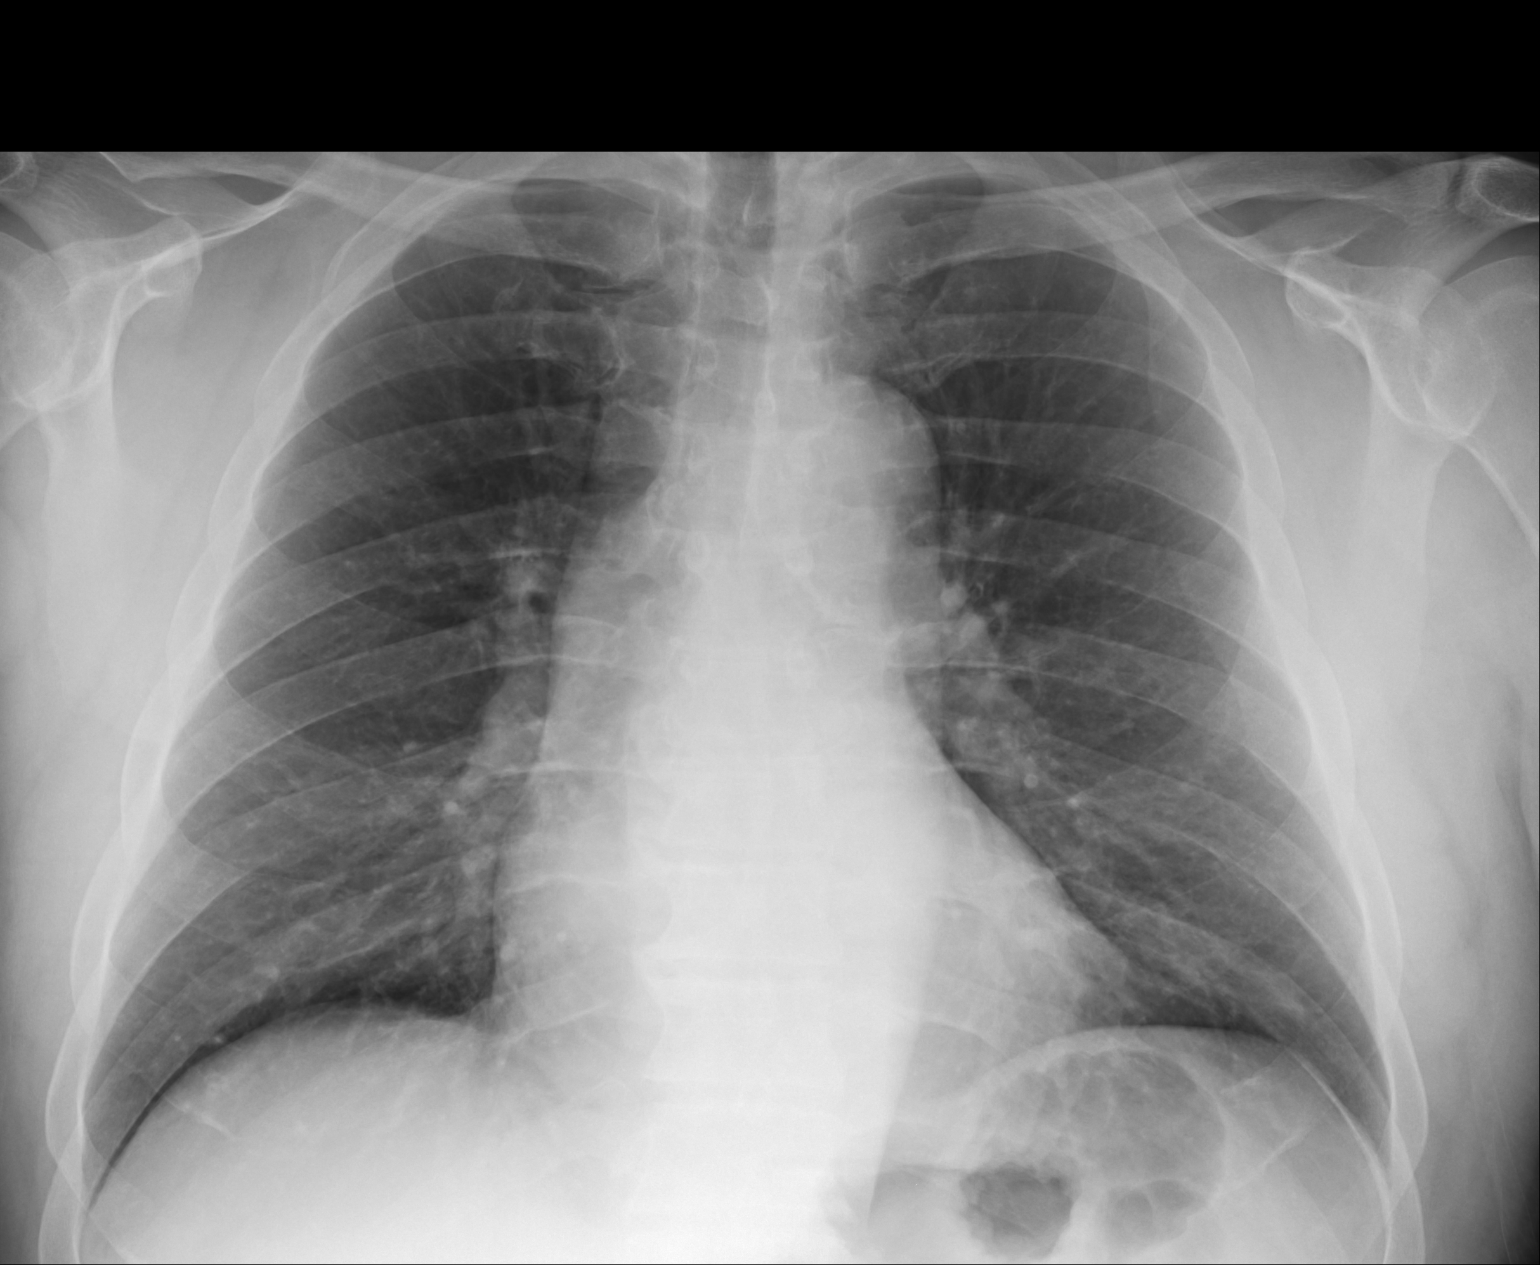
[im 2/2]
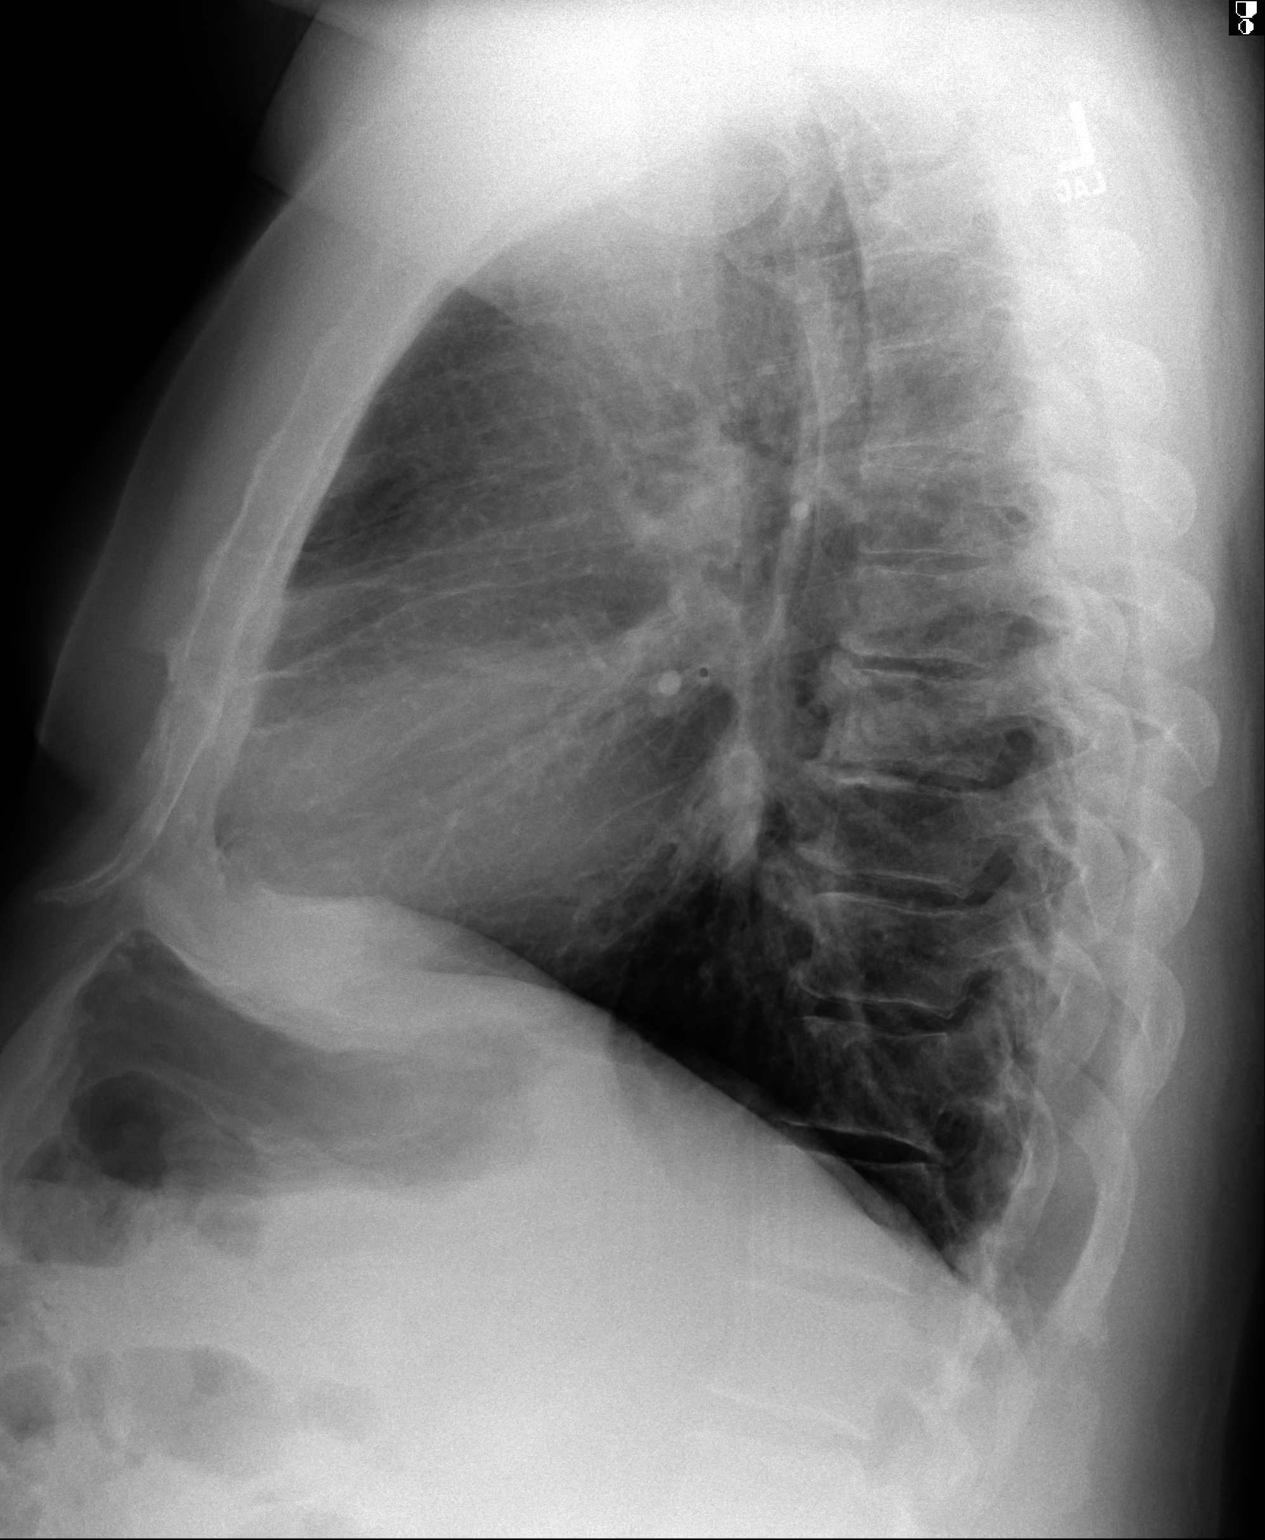

[2 of 2 positions shown; findings below may reference images not displayed]

IMPRESSION: 1. Chest radiograph without evidence of acute cardiopulmonary disease.

## 2015-01-17 ENCOUNTER — Other Ambulatory Visit: Payer: Self-pay | Admitting: Cardiovascular Disease

## 2015-05-30 ENCOUNTER — Other Ambulatory Visit: Payer: Self-pay | Admitting: Cardiovascular Disease

## 2015-08-28 ENCOUNTER — Other Ambulatory Visit: Payer: Self-pay | Admitting: Cardiovascular Disease

## 2015-09-14 ENCOUNTER — Ambulatory Visit (INDEPENDENT_AMBULATORY_CARE_PROVIDER_SITE_OTHER): Payer: BLUE CROSS/BLUE SHIELD | Admitting: Cardiovascular Disease

## 2015-09-14 ENCOUNTER — Encounter: Payer: Self-pay | Admitting: Cardiovascular Disease

## 2015-09-14 VITALS — BP 130/72 | HR 57 | Ht 72.5 in | Wt 267.5 lb

## 2015-09-14 DIAGNOSIS — E669 Obesity, unspecified: Secondary | ICD-10-CM

## 2015-09-14 DIAGNOSIS — N521 Erectile dysfunction due to diseases classified elsewhere: Secondary | ICD-10-CM | POA: Diagnosis not present

## 2015-09-14 DIAGNOSIS — I428 Other cardiomyopathies: Secondary | ICD-10-CM

## 2015-09-14 DIAGNOSIS — I10 Essential (primary) hypertension: Secondary | ICD-10-CM

## 2015-09-14 DIAGNOSIS — I429 Cardiomyopathy, unspecified: Secondary | ICD-10-CM

## 2015-09-14 DIAGNOSIS — I420 Dilated cardiomyopathy: Secondary | ICD-10-CM

## 2015-09-14 DIAGNOSIS — F1029 Alcohol dependence with unspecified alcohol-induced disorder: Secondary | ICD-10-CM

## 2015-09-14 DIAGNOSIS — E785 Hyperlipidemia, unspecified: Secondary | ICD-10-CM

## 2015-09-14 DIAGNOSIS — G4733 Obstructive sleep apnea (adult) (pediatric): Secondary | ICD-10-CM

## 2015-09-14 DIAGNOSIS — Z9989 Dependence on other enabling machines and devices: Secondary | ICD-10-CM

## 2015-09-14 MED ORDER — TADALAFIL 5 MG PO TABS: 5.0000 mg | ORAL_TABLET | Freq: Every day | ORAL | Status: DC

## 2015-09-14 MED ORDER — TADALAFIL 20 MG PO TABS: 20.0000 mg | ORAL_TABLET | Freq: Every day | ORAL | Status: DC | PRN

## 2015-09-14 NOTE — Assessment & Plan Note (Signed)
Most recent lab work not available. He has follow-up with primary care this week for routine lab work

## 2015-09-14 NOTE — Assessment & Plan Note (Signed)
We have encouraged continued exercise, careful diet management in an effort to lose weight. 

## 2015-09-14 NOTE — Progress Notes (Signed)
Patient ID: Casey Ringer., male    DOB: 1956-08-09, 59 y.o.   MRN: [id number]  HPI Comments: Casey Porrazzo. is a 59 y.o. male with PMHx s/f HTN, HLD, significant alcohol history drinking at least 6 beers per day , erectile dysfunction, OSA who wears CPAP , and obesity who presents today for follow-up of his hypertension. Previous cardiac catheterization with no significant disease in 2014  In follow-up, he reports he does not check his blood pressure at home. Initial blood pressure elevated on today's visit, improved on recheck. Initially 150 systolic, 130 on recheck. He has sleep apnea, no follow-up with sleep physician. Feels tired, gets up at 4:30 goes to the gym, busy day, does landscaping with 5 crews. Goes to bed at 9 PM. Some frequent urination. Denies having side effects on his medications  EKG on today's visit shows normal sinus rhythm with rate 57 bpm, left bundle branch block   Other past medical history  Previous stress test in 2012 which revealed no evidenced of ischemia, but reduced EF.  A follow-up 2D echo indicated EF 50-55% with mildly dilated LV and mild LVH, otherwise normal study.   Echocardiogram done recently for lower extremity edema showed depressed ejection fraction 35-40% with challenging images, inferior posterior wall hypokinesis.   continues to drink a significant amount of beer Weight continues to be a major issue. He has tried to watch his diet and be better but weight has not improved.   He has been on Lipitor in the past, but was unable to tolerate due to myalgias. ED improved on Cialis.   No Known Allergies  Current Outpatient Prescriptions on File Prior to Visit  Medication Sig Dispense Refill  . aspirin EC 81 MG tablet Take 1 tablet (81 mg total) by mouth daily. 25 tablet   . carvedilol (COREG) 6.25 MG tablet TAKE 1 TABLET BY MOUTH TWICE DAILY WITH A MEAL 180 tablet 3  . doxazosin (CARDURA) 8 MG tablet TAKE 1 TABLET BY MOUTH EVERY DAY 30  tablet 3  . losartan-hydrochlorothiazide (HYZAAR) 100-25 MG per tablet TAKE 1 TABLET BY MOUTH EVERY DAY 90 tablet 3  . Omega-3 Fatty Acids (FISH OIL) 1200 MG CAPS Take by mouth daily. Takes 3 tablets daily     No current facility-administered medications on file prior to visit.    Past Medical History  Diagnosis Date  . Obesity   . Hypertension   . Injury by electrocution   . Injury of shoulder   . Hyperlipidemia   . Obstructive sleep apnea     On CPAP  . LBBB (left bundle branch block)     Chronic    Past Surgical History  Procedure Laterality Date  . Shoulder surgery    . Wrist surgery      right  . Knee surgery    . Umbilical hernia repair    . Lumbar disc surgery    . Appendectomy    . Knee surgery      meniscus  . Cardiac catheterization  06/25/2013    [facility]; minor luminal irregularities, EF 45-50%;    Social History  reports that he has never smoked. His smokeless tobacco use includes Snuff. He reports that he drinks alcohol. He reports that he does not use illicit drugs.  Family History Family history is unknown by patient.  Review of Systems  Constitutional: Negative.   Respiratory: Negative.   Cardiovascular: Negative.   Gastrointestinal: Negative.   Musculoskeletal: Negative.  Neurological: Negative.   Hematological: Negative.   Psychiatric/Behavioral: Negative.   All other systems reviewed and are negative.   BP 130/72 mmHg  Pulse 57  Ht 6' 0.5" (1.842 m)  Wt 267 lb 8 oz (121.337 kg)  BMI 35.76 kg/m2  Physical Exam  Constitutional: He is oriented to person, place, and time. He appears well-developed and well-nourished.  HENT:  Head: Normocephalic.  Nose: Nose normal.  Mouth/Throat: Oropharynx is clear and moist.  Eyes: Conjunctivae are normal. Pupils are equal, round, and reactive to light.  Neck: Normal range of motion. Neck supple. No JVD present.  Cardiovascular: Normal rate, regular rhythm, S1 normal, S2 normal, normal heart sounds and  intact distal pulses.  Exam reveals no gallop and no friction rub.   No murmur heard. Pulmonary/Chest: Effort normal and breath sounds normal. No respiratory distress. He has no wheezes. He has no rales. He exhibits no tenderness.  Abdominal: Soft. Bowel sounds are normal. He exhibits no distension. There is no tenderness.  Musculoskeletal: Normal range of motion. He exhibits no edema or tenderness.  Lymphadenopathy:    He has no cervical adenopathy.  Neurological: He is alert and oriented to person, place, and time. Coordination normal.  Skin: Skin is warm and dry. No rash noted. No erythema.  Psychiatric: He has a normal mood and affect. His behavior is normal. Judgment and thought content normal.      Assessment and Plan   Nursing note and vitals reviewed.

## 2015-09-14 NOTE — Assessment & Plan Note (Signed)
2012 with ejection fraction 35-40%, possibly related to alcohol He is more compliant with his medications, no new clinical concerns for heart failure Appears relatively euvolemic

## 2015-09-14 NOTE — Assessment & Plan Note (Signed)
Recommended he consider follow-up with pulmonary sleep physician on an annual basis Reports compliance with his CPAP

## 2015-09-14 NOTE — Assessment & Plan Note (Signed)
Recommended minimizing alcohol use to avoid cardiomyopathy

## 2015-09-14 NOTE — Assessment & Plan Note (Signed)
Blood pressure is well controlled on today's visit. No changes made to the medications. 

## 2015-09-14 NOTE — Patient Instructions (Addendum)
You are doing well. No medication changes were made.  Zyrtec/cetirazine one a day for allergy  Aspirin every other day Track your blood pressure  Please call us if you have new issues that need to be addressed before your next appt.  Your physician wants you to follow-up in: 12 months.  You will receive a reminder letter in the mail two months in advance. If you don't receive a letter, please call our office to schedule the follow-up appointment.

## 2015-09-14 NOTE — Assessment & Plan Note (Signed)
He has requested a prescription for Cialis. He reports Viagra does not work for him

## 2015-10-03 ENCOUNTER — Other Ambulatory Visit: Payer: Self-pay | Admitting: Cardiovascular Disease

## 2016-03-19 DIAGNOSIS — G4733 Obstructive sleep apnea (adult) (pediatric): Secondary | ICD-10-CM | POA: Diagnosis not present

## 2016-03-20 DIAGNOSIS — M1711 Unilateral primary osteoarthritis, right knee: Secondary | ICD-10-CM | POA: Diagnosis not present

## 2016-03-22 DIAGNOSIS — D510 Vitamin B12 deficiency anemia due to intrinsic factor deficiency: Secondary | ICD-10-CM | POA: Diagnosis not present

## 2016-03-27 DIAGNOSIS — M2241 Chondromalacia patellae, right knee: Secondary | ICD-10-CM | POA: Diagnosis not present

## 2016-03-27 DIAGNOSIS — M25561 Pain in right knee: Secondary | ICD-10-CM | POA: Diagnosis not present

## 2016-03-27 DIAGNOSIS — M1711 Unilateral primary osteoarthritis, right knee: Secondary | ICD-10-CM | POA: Diagnosis not present

## 2016-04-24 DIAGNOSIS — E538 Deficiency of other specified B group vitamins: Secondary | ICD-10-CM | POA: Diagnosis not present

## 2016-05-08 DIAGNOSIS — M2241 Chondromalacia patellae, right knee: Secondary | ICD-10-CM | POA: Diagnosis not present

## 2016-05-08 DIAGNOSIS — M25561 Pain in right knee: Secondary | ICD-10-CM | POA: Diagnosis not present

## 2016-05-08 DIAGNOSIS — M1711 Unilateral primary osteoarthritis, right knee: Secondary | ICD-10-CM | POA: Diagnosis not present

## 2016-05-22 ENCOUNTER — Telehealth: Payer: Self-pay | Admitting: Cardiovascular Disease

## 2016-05-22 NOTE — Telephone Encounter (Signed)
-   Informed  patient of release of information packet being mailed.  - Mailed release packet to patient.  Received records request [employer], forwarded to [facility] for processing.(faxed and inner office)

## 2016-05-25 DIAGNOSIS — D510 Vitamin B12 deficiency anemia due to intrinsic factor deficiency: Secondary | ICD-10-CM | POA: Diagnosis not present

## 2016-06-27 DIAGNOSIS — D510 Vitamin B12 deficiency anemia due to intrinsic factor deficiency: Secondary | ICD-10-CM | POA: Diagnosis not present

## 2016-07-19 DIAGNOSIS — E538 Deficiency of other specified B group vitamins: Secondary | ICD-10-CM | POA: Diagnosis not present

## 2016-08-22 ENCOUNTER — Other Ambulatory Visit: Payer: Self-pay | Admitting: Cardiovascular Disease

## 2016-08-22 DIAGNOSIS — E538 Deficiency of other specified B group vitamins: Secondary | ICD-10-CM | POA: Diagnosis not present

## 2016-08-29 ENCOUNTER — Encounter: Payer: Self-pay | Admitting: Student

## 2016-08-29 ENCOUNTER — Ambulatory Visit (INDEPENDENT_AMBULATORY_CARE_PROVIDER_SITE_OTHER): Payer: BLUE CROSS/BLUE SHIELD | Admitting: Student

## 2016-08-29 VITALS — BP 140/88 | HR 69 | Ht 73.0 in | Wt 262.8 lb

## 2016-08-29 DIAGNOSIS — I429 Cardiomyopathy, unspecified: Secondary | ICD-10-CM | POA: Diagnosis not present

## 2016-08-29 DIAGNOSIS — I428 Other cardiomyopathies: Secondary | ICD-10-CM

## 2016-08-29 DIAGNOSIS — E785 Hyperlipidemia, unspecified: Secondary | ICD-10-CM

## 2016-08-29 DIAGNOSIS — N529 Male erectile dysfunction, unspecified: Secondary | ICD-10-CM

## 2016-08-29 DIAGNOSIS — G4733 Obstructive sleep apnea (adult) (pediatric): Secondary | ICD-10-CM

## 2016-08-29 DIAGNOSIS — I10 Essential (primary) hypertension: Secondary | ICD-10-CM | POA: Diagnosis not present

## 2016-08-29 DIAGNOSIS — Z9989 Dependence on other enabling machines and devices: Secondary | ICD-10-CM

## 2016-08-29 DIAGNOSIS — Z7289 Other problems related to lifestyle: Secondary | ICD-10-CM

## 2016-08-29 DIAGNOSIS — Z789 Other specified health status: Secondary | ICD-10-CM

## 2016-08-29 MED ORDER — CARVEDILOL 6.25 MG PO TABS: ORAL_TABLET | ORAL | 3 refills | Status: DC

## 2016-08-29 MED ORDER — TADALAFIL 20 MG PO TABS: 20.0000 mg | ORAL_TABLET | Freq: Every day | ORAL | 3 refills | Status: DC | PRN

## 2016-08-29 MED ORDER — DOXAZOSIN MESYLATE 8 MG PO TABS: 8.0000 mg | ORAL_TABLET | Freq: Every day | ORAL | 3 refills | Status: DC

## 2016-08-29 MED ORDER — LOSARTAN POTASSIUM-HCTZ 100-25 MG PO TABS: 1.0000 | ORAL_TABLET | Freq: Every day | ORAL | 3 refills | Status: DC

## 2016-08-29 NOTE — Patient Instructions (Signed)
Medication Instructions:  Please continue your current medications  Labwork: None  Testing/Procedures: None  Follow-Up: Your physician wants you to follow-up in: 1 year w/ Dr. Gollan. You will receive a reminder letter in the mail two months in advance.  If you don't receive a letter, please call our office to schedule the follow-up appointment.  If you need a refill on your cardiac medications before your next appointment, please call your pharmacy.   

## 2016-08-29 NOTE — Progress Notes (Signed)
Cardiology Office Note    Date:  08/29/2016   ID:  Casey Hess., DOB 1956-03-22, MRN [id number]  PCP:  Noni Saupe., MD  Cardiologist:  Dr. Mariah Milling  Chief Complaint  Patient presents with  . other    12 month follow up. Meds reviewed by the pt. verbally. "doing well."     History of Present Illness:    Casey Hess. is a 60 y.o. male with past medical history of HTN, HLD, OSA (on CPAP), known LBBB, alcohol dependence, and erectile dysfunction who presents today for routine annual follow-up.  Prior cardiac workup includes an echocardiogram in 05/2013 which showed a reduced EF of 35-40%. Underwent cardiac catheterization in 06/2013 which showed mild luminal irregularities with no significant CAD. Mild inferior wall HK was noted with an EF of 45-50%.  Last seen by Dr. Mariah Milling in 08/2015 for annual follow-up and doing well at that time. Reported continuing to consume 6+ beers daily. BP was well-controlled and he was continued on ASA 81mg  daily, Coreg 6.25mg  BID, Cardura 8mg  daily, and Hyzaar 100-25mg  daily.    Patient reports doing well since his last office visit. Denies any recent chest discomfort, palpitations, orthopnea, PND, or lower extremity edema. No recent hospitalizations. He works out three times per week using weight machines, rowing machines, and an elliptical. 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 No reported symptoms with these episodes.  Rarely checks his BP at home but says when he does check it, it's usually in the 120's - 130's. BP today is 140/88. HLD managed by his PCP and has an appointment in the next few weeks for his annual physical.  Does not use tobacco or recreational drugs. Still consumes 3-4 alcoholic beverages per day.     Past Medical History:  Diagnosis Date  . Hyperlipidemia   . Hypertension   . Injury by electrocution   . Injury of shoulder   . LBBB (left bundle branch block)    Chronic  .  Nonischemic cardiomyopathy (HCC)    a. 05/2013: echo w/ EF of 35-40% b. 06/2013: cath with mild luminal irregularities with no significant CAD. Mild inferior wall HK.  . Obesity   . Obstructive sleep apnea    a. on CPAP    Past Surgical History:  Procedure Laterality Date  . APPENDECTOMY    . CARDIAC CATHETERIZATION  06/25/2013   [facility]; minor luminal irregularities, EF 45-50%;  Marland Kitchen KNEE SURGERY    . KNEE SURGERY     meniscus  . LUMBAR DISC SURGERY    . SHOULDER SURGERY    . UMBILICAL HERNIA REPAIR    . WRIST SURGERY     right    Current Medications: Outpatient Medications Prior to Visit  Medication Sig Dispense Refill  . aspirin EC 81 MG tablet Take 1 tablet (81 mg total) by mouth daily. 25 tablet   . Omega-3 Fatty Acids (FISH OIL) 1200 MG CAPS Take by mouth daily. Takes 3 tablets daily    . carvedilol (COREG) 6.25 MG tablet TAKE 1 TABLET BY MOUTH TWICE DAILY WITH A MEAL 180 tablet 3  . doxazosin (CARDURA) 8 MG tablet TAKE 1 TABLET BY MOUTH EVERY DAY 30 tablet 11  . losartan-hydrochlorothiazide (HYZAAR) 100-25 MG per tablet TAKE 1 TABLET BY MOUTH EVERY DAY 90 tablet 3  . tadalafil (CIALIS) 20 MG tablet Take 1 tablet (20 mg total) by mouth daily as needed for erectile dysfunction. 6 tablet 11  .  tadalafil (CIALIS) 5 MG tablet Take 1 tablet (5 mg total) by mouth daily. 30 tablet 11   No facility-administered medications prior to visit.      Allergies:   Review of patient's allergies indicates no known allergies.   Social History   Social History  . Marital status: Single    Spouse name: N/A  . Number of children: N/A  . Years of education: N/A   Social History Main Topics  . Smoking status: Never Smoker  . Smokeless tobacco: Current User    Types: Snuff  . Alcohol use Yes     Comment: socially  . Drug use: No  . Sexual activity: Not Asked   Other Topics Concern  . None   Social History Narrative  . None     Family History:  The patient's family history includes  Hypertension in his father.   Review of Systems:   Please see the history of present illness.    Review of Systems  Constitution: Negative for chills, decreased appetite and fever.  Cardiovascular: Negative for chest pain, irregular heartbeat, palpitations and syncope.  Respiratory: Negative for shortness of breath and wheezing.   Musculoskeletal: Negative for arthritis, falls and muscle weakness.  Gastrointestinal: Negative for abdominal pain, hematochezia, melena, nausea and vomiting.  Neurological: Negative for dizziness, loss of balance and vertigo.   All other systems reviewed and are negative.   Physical Exam:    VS:  BP 140/88 (BP Location: Left Arm, Patient Position: Sitting, Cuff Size: Large)   Pulse 69   Ht 6\' 1"  (1.854 m)   Wt 262 lb 12 oz (119.2 kg)   BMI 34.67 kg/m    General: Overweight Caucasian male appearing in no acute distress. Head: Normocephalic, atraumatic, sclera non-icteric, no xanthomas, nares are without discharge.  Neck: No carotid bruits. JVD not elevated.  Lungs: Respirations regular and unlabored, without wheezes or rales.  Heart: Regular rate and rhythm. No S3 or S4.  No murmur, no rubs, or gallops appreciated. Abdomen: Soft, non-tender, non-distended with normoactive bowel sounds. No hepatomegaly. No rebound/guarding. No obvious abdominal masses. Msk:  Strength and tone appear normal for age. No joint deformities or effusions. Extremities: No clubbing or cyanosis. No edema.  Distal pedal pulses are 2+ bilaterally. Neuro: Alert and oriented X 3. Moves all extremities spontaneously. No focal deficits noted. Psych:  Responds to questions appropriately with a normal affect. Skin: No rashes or lesions noted  Wt Readings from Last 3 Encounters:  08/29/16 262 lb 12 oz (119.2 kg)  09/14/15 267 lb 8 oz (121.3 kg)  07/07/14 275 lb (124.7 kg)      Studies/Labs Reviewed:   EKG:  EKG is ordered today.  The ekg ordered today demonstrates NSR, HR 70, with  known LBBB. QTc 481.   Recent Labs: No results found for requested labs within last 8760 hours.   Lipid Panel    Component Value Date/Time   CHOL 222 (H) 02/01/2010 2032   TRIG 111 02/01/2010 2032   HDL 48 02/01/2010 2032   CHOLHDL 4.6 Ratio 02/01/2010 2032   VLDL 22 02/01/2010 2032   LDLCALC 152 (H) 02/01/2010 2032    Additional studies/ records that were reviewed today include:  Cardiac Catheterization: 06/2013   Assessment:    1. Nonischemic cardiomyopathy (HCC)   2. Essential hypertension   3. Hyperlipidemia   4. OSA on CPAP   5. Alcohol use (HCC)   6. Erectile dysfunction, unspecified erectile dysfunction type  Plan:   In order of problems listed above:  1. Nonischemic Cardiomyopathy - echo in 05/2013 showed a reduced EF of 35-40%. Underwent cardiac catheterization in 06/2013 which showed mild luminal irregularities with no significant CAD. Mild inferior wall HK was noted with an EF of 45-50%. - denies any recent orthopnea, PND, or lower extremity edema. Does not appear volume overloaded on physical exam today.  - continue BB along with ARB. No current changes to medications. Patient encouraged to limit alcohol use as this was thought to play a role when his cardiomyopathy was initially diagnosed in 2014.    2. HTN - SBP usually in the 120's - 130's at home. At 140/88 during office visit today which patient states is very unusual (has been under increased stress today, completing chores prior to leaving for the beach).  - will continue Coreg 6.25mg  BID, Cardura 8mg  daily, and Hyzaar 100-25mg  daily. Provided with annual refills. Encouraged to check BP at home on a weekly basis and to report back if SBP sustains in the 140's or greater. Will have BMET at annual physical in the coming month and patient wishes to wait until then to have his labs checked.   3. HLD - followed by PCP. For annual physical in the coming month and due for labs at that time.  - not  currently on statin therapy.   4. OSA - continue to utilize CPAP. Encouraged to follow-up with Pulmonology on annual basis.   5. Alcohol Dependence - has cut down from 6 beers daily to 3-4 beers daily. Encouraged to continue to minimize use to avoid worsening cardiomyopathy.    6. Erectile Dysfunction - uses Cialis PRN. Requested refill as his current Rx has expired.   Medication Adjustments/Labs and Tests Ordered: Current medicines are reviewed at length with the patient today.  Concerns regarding medicines are outlined above.  Medication changes, Labs and Tests ordered today are listed in the Patient Instructions below. Patient Instructions  Medication Instructions:  Please continue your current medications  Labwork: None  Testing/Procedures: None  Follow-Up: Your physician wants you to follow-up in: 1 year w/ Dr. Billey [place] will receive a reminder letter in the mail two months in advance.  If you don't receive a letter, please call our office to schedule the follow-up appointment.  If you need a refill on your cardiac medications before your next appointment, please call your pharmacy.      Signed, Casey Lennox, PA  08/29/2016 2:17 PM    [facility] [address] [place], [address] [place], [place]  [address] Phone: [phone]; Fax: [phone]  [address], [address] [place], [place] [address] Phone: [phone]

## 2016-09-19 DIAGNOSIS — D510 Vitamin B12 deficiency anemia due to intrinsic factor deficiency: Secondary | ICD-10-CM | POA: Diagnosis not present

## 2016-09-19 DIAGNOSIS — Z23 Encounter for immunization: Secondary | ICD-10-CM | POA: Diagnosis not present

## 2016-09-21 ENCOUNTER — Other Ambulatory Visit: Payer: Self-pay | Admitting: Cardiovascular Disease

## 2016-09-21 NOTE — Telephone Encounter (Signed)
Pt requesting refill ok to refill? 

## 2016-09-25 DIAGNOSIS — Z23 Encounter for immunization: Secondary | ICD-10-CM | POA: Diagnosis not present

## 2016-09-25 DIAGNOSIS — Z1389 Encounter for screening for other disorder: Secondary | ICD-10-CM | POA: Diagnosis not present

## 2016-09-25 DIAGNOSIS — Z0000 Encounter for general adult medical examination without abnormal findings: Secondary | ICD-10-CM | POA: Diagnosis not present

## 2016-10-11 DIAGNOSIS — G4733 Obstructive sleep apnea (adult) (pediatric): Secondary | ICD-10-CM | POA: Diagnosis not present

## 2016-10-15 DIAGNOSIS — R1032 Left lower quadrant pain: Secondary | ICD-10-CM | POA: Diagnosis not present

## 2016-10-25 DIAGNOSIS — Z1211 Encounter for screening for malignant neoplasm of colon: Secondary | ICD-10-CM | POA: Diagnosis not present

## 2016-10-25 DIAGNOSIS — D126 Benign neoplasm of colon, unspecified: Secondary | ICD-10-CM | POA: Diagnosis not present

## 2016-10-25 DIAGNOSIS — D124 Benign neoplasm of descending colon: Secondary | ICD-10-CM | POA: Diagnosis not present

## 2016-10-25 DIAGNOSIS — I10 Essential (primary) hypertension: Secondary | ICD-10-CM | POA: Diagnosis not present

## 2016-10-25 DIAGNOSIS — F17220 Nicotine dependence, chewing tobacco, uncomplicated: Secondary | ICD-10-CM | POA: Diagnosis not present

## 2016-10-25 DIAGNOSIS — Z86010 Personal history of colonic polyps: Secondary | ICD-10-CM | POA: Diagnosis not present

## 2016-10-29 DIAGNOSIS — D510 Vitamin B12 deficiency anemia due to intrinsic factor deficiency: Secondary | ICD-10-CM | POA: Diagnosis not present

## 2016-11-28 DIAGNOSIS — D510 Vitamin B12 deficiency anemia due to intrinsic factor deficiency: Secondary | ICD-10-CM | POA: Diagnosis not present

## 2016-12-20 DIAGNOSIS — D510 Vitamin B12 deficiency anemia due to intrinsic factor deficiency: Secondary | ICD-10-CM | POA: Diagnosis not present

## 2017-01-22 DIAGNOSIS — J209 Acute bronchitis, unspecified: Secondary | ICD-10-CM | POA: Diagnosis not present

## 2017-01-22 DIAGNOSIS — D510 Vitamin B12 deficiency anemia due to intrinsic factor deficiency: Secondary | ICD-10-CM | POA: Diagnosis not present

## 2017-02-14 DIAGNOSIS — D510 Vitamin B12 deficiency anemia due to intrinsic factor deficiency: Secondary | ICD-10-CM | POA: Diagnosis not present

## 2017-03-20 DIAGNOSIS — D510 Vitamin B12 deficiency anemia due to intrinsic factor deficiency: Secondary | ICD-10-CM | POA: Diagnosis not present

## 2017-05-01 DIAGNOSIS — D510 Vitamin B12 deficiency anemia due to intrinsic factor deficiency: Secondary | ICD-10-CM | POA: Diagnosis not present

## 2017-05-27 DIAGNOSIS — D510 Vitamin B12 deficiency anemia due to intrinsic factor deficiency: Secondary | ICD-10-CM | POA: Diagnosis not present

## 2017-06-26 DIAGNOSIS — D510 Vitamin B12 deficiency anemia due to intrinsic factor deficiency: Secondary | ICD-10-CM | POA: Diagnosis not present

## 2017-07-24 DIAGNOSIS — D510 Vitamin B12 deficiency anemia due to intrinsic factor deficiency: Secondary | ICD-10-CM | POA: Diagnosis not present

## 2017-08-22 DIAGNOSIS — D510 Vitamin B12 deficiency anemia due to intrinsic factor deficiency: Secondary | ICD-10-CM | POA: Diagnosis not present

## 2017-09-15 ENCOUNTER — Other Ambulatory Visit: Payer: Self-pay | Admitting: Student

## 2017-09-17 ENCOUNTER — Other Ambulatory Visit: Payer: Self-pay | Admitting: Student

## 2017-09-26 DIAGNOSIS — D510 Vitamin B12 deficiency anemia due to intrinsic factor deficiency: Secondary | ICD-10-CM | POA: Diagnosis not present

## 2017-10-21 ENCOUNTER — Other Ambulatory Visit: Payer: Self-pay | Admitting: Cardiovascular Disease

## 2017-10-21 DIAGNOSIS — D510 Vitamin B12 deficiency anemia due to intrinsic factor deficiency: Secondary | ICD-10-CM | POA: Diagnosis not present

## 2017-10-31 ENCOUNTER — Ambulatory Visit: Payer: BLUE CROSS/BLUE SHIELD | Admitting: Cardiovascular Disease

## 2017-11-21 ENCOUNTER — Other Ambulatory Visit: Payer: Self-pay | Admitting: Cardiovascular Disease

## 2017-11-26 ENCOUNTER — Ambulatory Visit: Payer: BLUE CROSS/BLUE SHIELD | Admitting: Cardiovascular Disease

## 2017-11-27 DIAGNOSIS — D510 Vitamin B12 deficiency anemia due to intrinsic factor deficiency: Secondary | ICD-10-CM | POA: Diagnosis not present

## 2017-12-18 DIAGNOSIS — D510 Vitamin B12 deficiency anemia due to intrinsic factor deficiency: Secondary | ICD-10-CM | POA: Diagnosis not present

## 2017-12-26 NOTE — Progress Notes (Signed)
Ejection fraction 35% by echocardiogram 2014 up to cardiology Office Note  Date:  12/27/2017   ID:  Casey Ringer., DOB [birth year], MRN [id number]  PCP:  Noni Saupe, MD   Chief Complaint  Patient presents with  . Other    12 month follow up. Patient c/o when sleeping at night hands are going numb. Meds reviewed verbally with patient.     HPI:   Casey Lineweaver. is a 62 y.o. male with PMHx of HTN,  HLD,  smoker significant alcohol history drinking at least 6 beers per day ,  erectile dysfunction,  OSA who wears CPAP , obesity  cardiac catheterization with no significant disease in 2014 Echocardiogram 2014 ejection fraction 35-40% Cardiac catheterization 2014 ejection fraction 45-50% who presents today for follow-up of his hypertension.  Working hard, Administrator, hampered by the weather,  Does not check blood pressure at home Denies any side effects from his medications Active during the day with no regular exercise program Continues to drink beers every day, Michelob ultra  denies any significant chest pain on exertion Difficulty losing weight  Non-smoker no diabetes,   EKG on today's visit shows normal sinus rhythm with rate 70 bpm, left bundle branch block  No change in EKG  Other past medical history  Previous stress test in 2012 which revealed no evidenced of ischemia, but reduced EF.  A follow-up 2D echo indicated EF 50-55% with mildly dilated LV and mild LVH, otherwise normal study.   Echocardiogram done recently for lower extremity edema showed depressed ejection fraction 35-40% with challenging images, inferior posterior wall hypokinesis.   continues to drink a significant amount of beer Weight continues to be a major issue. He has tried to watch his diet and be better but weight has not improved.   He has been on Lipitor in the past, but was unable to tolerate due to myalgias. ED improved on Cialis.    PMH:   has a past medical history of  Hyperlipidemia, Hypertension, Injury by electrocution, Injury of shoulder, LBBB (left bundle branch block), Nonischemic cardiomyopathy (HCC), Obesity, and Obstructive sleep apnea.  PSH:    Past Surgical History:  Procedure Laterality Date  . APPENDECTOMY    . CARDIAC CATHETERIZATION  06/25/2013   [facility]; minor luminal irregularities, EF 45-50%;  Marland Kitchen KNEE SURGERY    . KNEE SURGERY     meniscus  . LUMBAR DISC SURGERY    . SHOULDER SURGERY    . UMBILICAL HERNIA REPAIR    . WRIST SURGERY     right    Current Outpatient Medications  Medication Sig Dispense Refill  . aspirin EC 81 MG tablet Take 1 tablet (81 mg total) by mouth daily. 25 tablet   . carvedilol (COREG) 6.25 MG tablet TAKE 1 TABLET BY MOUTH TWICE DAILY WITH A MEAL 60 tablet 0  . doxazosin (CARDURA) 8 MG tablet TAKE 1 TABLET BY MOUTH EVERY DAY 30 tablet 3  . losartan-hydrochlorothiazide (HYZAAR) 100-25 MG tablet TAKE 1 TABLET BY MOUTH DAILY 90 tablet 1  . Omega-3 Fatty Acids (FISH OIL) 1200 MG CAPS Take by mouth daily. Takes 3 tablets daily    . tadalafil (CIALIS) 20 MG tablet Take 1 tablet (20 mg total) by mouth daily as needed for erectile dysfunction. 6 tablet 3   No current facility-administered medications for this visit.      Allergies:   Patient has no known allergies.   Social History:  The patient  reports that  has never smoked. His smokeless tobacco use includes snuff. He reports that he drinks alcohol. He reports that he does not use drugs.   Family History:   family history includes Hypertension in his father.    Review of Systems: Review of Systems  Constitutional: Negative.   Respiratory: Negative.   Cardiovascular: Negative.   Gastrointestinal: Negative.   Musculoskeletal: Negative.   Neurological: Negative.   Psychiatric/Behavioral: Negative.   All other systems reviewed and are negative.    PHYSICAL EXAM: VS:  BP 136/80 (BP Location: Left Arm, Patient Position: Sitting, Cuff Size: Normal)    Pulse 70   Ht 6\' 1"  (1.854 m)   Wt 271 lb (122.9 kg)   BMI 35.75 kg/m  , BMI Body mass index is 35.75 kg/m. GEN: Well nourished, well developed, in no acute distress,  obese  HEENT: normal  Neck: no JVD, carotid bruits, or masses Cardiac: RRR; no murmurs, rubs, or gallops,no edema  Respiratory:  clear to auscultation bilaterally, normal work of breathing GI: soft, nontender, nondistended, + BS MS: no deformity or atrophy  Skin: warm and dry, no rash Neuro:  Strength and sensation are intact Psych: euthymic mood, full affect    Recent Labs: No results found for requested labs within last 8760 hours.    Lipid Panel Lab Results  Component Value Date   CHOL 222 (H) 02/01/2010   HDL 48 02/01/2010   LDLCALC 152 (H) 02/01/2010   TRIG 111 02/01/2010      Wt Readings from Last 3 Encounters:  12/27/17 271 lb (122.9 kg)  08/29/16 262 lb 12 oz (119.2 kg)  09/14/15 267 lb 8 oz (121.3 kg)       ASSESSMENT AND PLAN:  Nonischemic cardiomyopathy (HCC) Recommended he cut way down on his alcohol Unable to exclude alcohol cardiomyopathy Previous ejection fraction 35-40% Up to 45-50% on cardiac catheterization 2014 Appears relatively euvolemic  if leg swelling gets worse could give Lasix as needed In addition to his HCTZ  Mixed hyperlipidemia Lab work pending through primary care next week No significant coronary disease by history  Essential hypertension Blood pressure is well controlled on today's visit. No changes made to the medications.  Uncomplicated alcohol dependence (HCC) Drinks beer every night Likely contributing to weight gain  OSA on CPAP Stable recommended weight loss   Total encounter time more than 25 minutes  Greater than 50% was spent in counseling and coordination of care with the patient   Disposition:   F/U  12 months  No orders of the defined types were placed in this encounter.    Signed, Dossie Arbour, M.D., Ph.D. 12/27/2017  [facility] [place], [place] [phone]

## 2017-12-27 ENCOUNTER — Ambulatory Visit (INDEPENDENT_AMBULATORY_CARE_PROVIDER_SITE_OTHER): Payer: BLUE CROSS/BLUE SHIELD | Admitting: Cardiovascular Disease

## 2017-12-27 ENCOUNTER — Encounter: Payer: Self-pay | Admitting: Cardiovascular Disease

## 2017-12-27 VITALS — BP 136/80 | HR 70 | Ht 73.0 in | Wt 271.0 lb

## 2017-12-27 DIAGNOSIS — F1020 Alcohol dependence, uncomplicated: Secondary | ICD-10-CM

## 2017-12-27 DIAGNOSIS — G4733 Obstructive sleep apnea (adult) (pediatric): Secondary | ICD-10-CM

## 2017-12-27 DIAGNOSIS — I428 Other cardiomyopathies: Secondary | ICD-10-CM | POA: Diagnosis not present

## 2017-12-27 DIAGNOSIS — I10 Essential (primary) hypertension: Secondary | ICD-10-CM

## 2017-12-27 DIAGNOSIS — Z9989 Dependence on other enabling machines and devices: Secondary | ICD-10-CM | POA: Diagnosis not present

## 2017-12-27 DIAGNOSIS — Z720 Tobacco use: Secondary | ICD-10-CM | POA: Diagnosis not present

## 2017-12-27 DIAGNOSIS — E782 Mixed hyperlipidemia: Secondary | ICD-10-CM

## 2017-12-27 NOTE — Patient Instructions (Signed)

## 2017-12-29 ENCOUNTER — Other Ambulatory Visit: Payer: Self-pay | Admitting: Cardiovascular Disease

## 2018-01-02 DIAGNOSIS — Z6836 Body mass index (BMI) 36.0-36.9, adult: Secondary | ICD-10-CM | POA: Diagnosis not present

## 2018-01-02 DIAGNOSIS — Z0000 Encounter for general adult medical examination without abnormal findings: Secondary | ICD-10-CM | POA: Diagnosis not present

## 2018-01-02 DIAGNOSIS — Z1331 Encounter for screening for depression: Secondary | ICD-10-CM | POA: Diagnosis not present

## 2018-01-02 DIAGNOSIS — Z13220 Encounter for screening for lipoid disorders: Secondary | ICD-10-CM | POA: Diagnosis not present

## 2018-01-02 DIAGNOSIS — Z125 Encounter for screening for malignant neoplasm of prostate: Secondary | ICD-10-CM | POA: Diagnosis not present

## 2018-01-17 DIAGNOSIS — D510 Vitamin B12 deficiency anemia due to intrinsic factor deficiency: Secondary | ICD-10-CM | POA: Diagnosis not present

## 2018-01-27 ENCOUNTER — Other Ambulatory Visit: Payer: Self-pay | Admitting: Cardiovascular Disease

## 2018-02-18 ENCOUNTER — Telehealth: Payer: Self-pay | Admitting: Cardiovascular Disease

## 2018-02-18 NOTE — Telephone Encounter (Signed)
Pt states he had his testosterone checked, and his PCP told him it was low. He wanted to let us know. Please call to discuss.

## 2018-02-18 NOTE — Telephone Encounter (Signed)
Spoke with patient and he reports that Dr. Mariah Milling requested they check his testosterone level as he may need testosterone shots. He reports that the level did come back low. Reviewed chart and I do not see labs in Epic so requested that he please call and have them faxed to our office so I can send over to Dr. Mariah Milling for review. He verbalized understanding with no further questions at this time.

## 2018-02-18 NOTE — Telephone Encounter (Signed)
No answer. Mailbox full, unable to leave a message.

## 2018-02-18 NOTE — Telephone Encounter (Signed)
Pt returned your call.  

## 2018-02-20 NOTE — Telephone Encounter (Signed)
Spoke with patient and reviewed that I did receive labs from his PCP office. Testosterone level was 363 and normal range was 264-916. He states that Dr. Mariah Milling had him request this lab and he wanted to let him know that he did have it done. Advised that I would make Dr. Mariah Milling aware and if he should have any recommendations I would give him a call back. He verbalized understanding and was appreciative for the call back.

## 2018-02-21 NOTE — Telephone Encounter (Signed)
We will leave management per primary care Low end of normal

## 2018-02-24 DIAGNOSIS — D510 Vitamin B12 deficiency anemia due to intrinsic factor deficiency: Secondary | ICD-10-CM | POA: Diagnosis not present

## 2018-02-26 ENCOUNTER — Other Ambulatory Visit: Payer: Self-pay | Admitting: Cardiovascular Disease

## 2018-02-27 ENCOUNTER — Other Ambulatory Visit: Payer: Self-pay | Admitting: Student

## 2018-04-07 DIAGNOSIS — D510 Vitamin B12 deficiency anemia due to intrinsic factor deficiency: Secondary | ICD-10-CM | POA: Diagnosis not present

## 2018-04-25 DIAGNOSIS — D510 Vitamin B12 deficiency anemia due to intrinsic factor deficiency: Secondary | ICD-10-CM | POA: Diagnosis not present

## 2018-05-26 DIAGNOSIS — D510 Vitamin B12 deficiency anemia due to intrinsic factor deficiency: Secondary | ICD-10-CM | POA: Diagnosis not present

## 2018-06-23 ENCOUNTER — Other Ambulatory Visit: Payer: Self-pay | Admitting: Cardiovascular Disease

## 2018-06-27 DIAGNOSIS — D510 Vitamin B12 deficiency anemia due to intrinsic factor deficiency: Secondary | ICD-10-CM | POA: Diagnosis not present

## 2018-07-18 DIAGNOSIS — D510 Vitamin B12 deficiency anemia due to intrinsic factor deficiency: Secondary | ICD-10-CM | POA: Diagnosis not present

## 2018-07-21 ENCOUNTER — Other Ambulatory Visit: Payer: Self-pay | Admitting: Cardiovascular Disease

## 2018-08-21 DIAGNOSIS — D510 Vitamin B12 deficiency anemia due to intrinsic factor deficiency: Secondary | ICD-10-CM | POA: Diagnosis not present

## 2018-09-23 DIAGNOSIS — D510 Vitamin B12 deficiency anemia due to intrinsic factor deficiency: Secondary | ICD-10-CM | POA: Diagnosis not present

## 2018-10-07 DIAGNOSIS — Z23 Encounter for immunization: Secondary | ICD-10-CM | POA: Diagnosis not present

## 2018-10-23 DIAGNOSIS — D510 Vitamin B12 deficiency anemia due to intrinsic factor deficiency: Secondary | ICD-10-CM | POA: Diagnosis not present

## 2018-11-25 DIAGNOSIS — D510 Vitamin B12 deficiency anemia due to intrinsic factor deficiency: Secondary | ICD-10-CM | POA: Diagnosis not present

## 2018-12-18 ENCOUNTER — Other Ambulatory Visit: Payer: Self-pay | Admitting: Cardiovascular Disease

## 2018-12-23 DIAGNOSIS — D510 Vitamin B12 deficiency anemia due to intrinsic factor deficiency: Secondary | ICD-10-CM | POA: Diagnosis not present

## 2018-12-24 DIAGNOSIS — M1711 Unilateral primary osteoarthritis, right knee: Secondary | ICD-10-CM | POA: Diagnosis not present

## 2018-12-24 DIAGNOSIS — M25561 Pain in right knee: Secondary | ICD-10-CM | POA: Diagnosis not present

## 2019-01-06 DIAGNOSIS — Z13220 Encounter for screening for lipoid disorders: Secondary | ICD-10-CM | POA: Diagnosis not present

## 2019-01-06 DIAGNOSIS — Z125 Encounter for screening for malignant neoplasm of prostate: Secondary | ICD-10-CM | POA: Diagnosis not present

## 2019-01-06 DIAGNOSIS — Z0000 Encounter for general adult medical examination without abnormal findings: Secondary | ICD-10-CM | POA: Diagnosis not present

## 2019-01-06 DIAGNOSIS — Z1331 Encounter for screening for depression: Secondary | ICD-10-CM | POA: Diagnosis not present

## 2019-01-06 DIAGNOSIS — Z6837 Body mass index (BMI) 37.0-37.9, adult: Secondary | ICD-10-CM | POA: Diagnosis not present

## 2019-01-12 DIAGNOSIS — M1711 Unilateral primary osteoarthritis, right knee: Secondary | ICD-10-CM | POA: Diagnosis not present

## 2019-01-12 DIAGNOSIS — M545 Low back pain: Secondary | ICD-10-CM | POA: Diagnosis not present

## 2019-01-12 DIAGNOSIS — M5416 Radiculopathy, lumbar region: Secondary | ICD-10-CM | POA: Diagnosis not present

## 2019-01-13 DIAGNOSIS — M1711 Unilateral primary osteoarthritis, right knee: Secondary | ICD-10-CM | POA: Diagnosis not present

## 2019-01-17 ENCOUNTER — Other Ambulatory Visit: Payer: Self-pay | Admitting: Cardiovascular Disease

## 2019-01-20 ENCOUNTER — Telehealth: Payer: Self-pay | Admitting: Cardiovascular Disease

## 2019-01-20 DIAGNOSIS — M1711 Unilateral primary osteoarthritis, right knee: Secondary | ICD-10-CM | POA: Diagnosis not present

## 2019-01-20 NOTE — Telephone Encounter (Signed)
Pt would like a call to discuss his fluid medication. States it makes him use the bathroom too much.

## 2019-01-20 NOTE — Telephone Encounter (Signed)
Attempted to reach patient. Mailbox is full and could not leave message.

## 2019-01-21 NOTE — Telephone Encounter (Signed)
I attempted to contact the patient.  Message states his mail box is full, unable to leave a message.

## 2019-01-21 NOTE — Telephone Encounter (Signed)
The patient called back.  He states that he recently saw his PCP, Dr. Jeanie Sewer in [place].   Dr. Jeanie Sewer was asking him if he was having any issues.  Per the patient, he advised him that he was going to the bathroom a lot.  The patient states Dr. Jeanie Sewer also mentioned that the patient should maybe be on a statin drug. The patient advised Dr. Jeanie Sewer was supposed to be sending information to Dr. Mariah Milling from his physical.   I informed the patient I do not see anything in Dr. Windell Hummingbird inbasket from Dr. Jeanie Sewer.  I am uncertain if a note was faxed.  The patient is aware that he has an appointment on 02/06/19 with Ward Givens, NP. The patient states that he has not seen Dr. Mariah Milling the last 3 times he was here. I offered to work the patient in today at 2:20 pm. The patient states he could not make today. I advised the patient Dr. Windell Hummingbird next available is March 10. He felt this was unacceptable and stated "maybe I should find another doctor who can see me." I advised the patient again that I offered him a same day appointment today, but he declined. The patient states he will see the NP.  I have advised the patient to please contact his PCP office and have them fax his records to [name] for his upcoming appointment with Ward Givens, NP.  The patient voices understanding.

## 2019-01-26 DIAGNOSIS — M5416 Radiculopathy, lumbar region: Secondary | ICD-10-CM | POA: Diagnosis not present

## 2019-01-27 DIAGNOSIS — M1711 Unilateral primary osteoarthritis, right knee: Secondary | ICD-10-CM | POA: Diagnosis not present

## 2019-01-27 DIAGNOSIS — D510 Vitamin B12 deficiency anemia due to intrinsic factor deficiency: Secondary | ICD-10-CM | POA: Diagnosis not present

## 2019-02-02 DIAGNOSIS — M48061 Spinal stenosis, lumbar region without neurogenic claudication: Secondary | ICD-10-CM | POA: Diagnosis not present

## 2019-02-02 DIAGNOSIS — M5136 Other intervertebral disc degeneration, lumbar region: Secondary | ICD-10-CM | POA: Diagnosis not present

## 2019-02-06 ENCOUNTER — Ambulatory Visit (INDEPENDENT_AMBULATORY_CARE_PROVIDER_SITE_OTHER): Payer: BLUE CROSS/BLUE SHIELD | Admitting: Nurse Practitioner

## 2019-02-06 ENCOUNTER — Telehealth: Payer: Self-pay

## 2019-02-06 ENCOUNTER — Encounter: Payer: Self-pay | Admitting: Nurse Practitioner

## 2019-02-06 VITALS — BP 160/96 | HR 59 | Ht 73.0 in | Wt 274.0 lb

## 2019-02-06 DIAGNOSIS — I428 Other cardiomyopathies: Secondary | ICD-10-CM

## 2019-02-06 DIAGNOSIS — E782 Mixed hyperlipidemia: Secondary | ICD-10-CM | POA: Diagnosis not present

## 2019-02-06 DIAGNOSIS — I10 Essential (primary) hypertension: Secondary | ICD-10-CM | POA: Diagnosis not present

## 2019-02-06 DIAGNOSIS — F1010 Alcohol abuse, uncomplicated: Secondary | ICD-10-CM | POA: Diagnosis not present

## 2019-02-06 MED ORDER — LOSARTAN POTASSIUM 100 MG PO TABS: 100.0000 mg | ORAL_TABLET | Freq: Every day | ORAL | 3 refills | Status: DC

## 2019-02-06 MED ORDER — AMLODIPINE BESYLATE 5 MG PO TABS: 5.0000 mg | ORAL_TABLET | Freq: Every day | ORAL | 3 refills | Status: DC

## 2019-02-06 MED ORDER — TADALAFIL 20 MG PO TABS: 20.0000 mg | ORAL_TABLET | Freq: Every day | ORAL | 3 refills | Status: DC | PRN

## 2019-02-06 NOTE — Telephone Encounter (Signed)
PA started through [facility]: [id number]   Your information has been submitted to [facility]. [employer] [place] will review the request and fax you a determination directly, typically within 3 business days of your submission once all necessary information is received. If [employer] has not responded in 3 business days or if you have any questions about your submission, contact [employer]  at [phone].

## 2019-02-06 NOTE — Patient Instructions (Addendum)
Medication Instructions:  Your physician has recommended you make the following change in your medication:  1- STOP Losartan HCTZ  2- START Losartan Take 1 tablet (100 mg total) by mouth daily 3- START Amlodipine Take 1 tablet (5 mg total) by mouth daily  If you need a refill on your cardiac medications before your next appointment, please call your pharmacy.   Lab work: None ordered  If you have labs (blood work) drawn today and your tests are completely normal, you will receive your results only by: Marland Kitchen MyChart Message (if you have MyChart) OR . A paper copy in the mail If you have any lab test that is abnormal or we need to change your treatment, we will call you to review the results.  Testing/Procedures: None ordered   Follow-Up: At [facility], you and your health needs are our priority.  As part of our continuing mission to provide you with exceptional heart care, we have created designated Provider Care Teams.  These Care Teams include your primary Cardiologist (physician) and Advanced Practice Providers (APPs -  Physician Assistants and Nurse Practitioners) who all work together to provide you with the care you need, when you need it. You will need a follow up appointment in 1 years.  Please call our office 2 months in advance to schedule this appointment.  Please see Julien Nordmann, MD.  Any Other Special Instructions Will Be Listed Below (If Applicable). Please take BP readings daily at the same time while in seated position, 1 hr after medications. Record and let clinic know in approximately 3 weeks.

## 2019-02-06 NOTE — Progress Notes (Signed)
Office Visit    Patient Name: Casey Hess. Date of Encounter: 02/06/2019  Primary Care Provider:  Noni Saupe, MD Primary Cardiologist:  Julien Nordmann, MD  Chief Complaint    63 year old male with a history of nonischemic cardiomyopathy, hypertension, hyperlipidemia, alcohol abuse, obesity, and sleep apnea, who presents for follow-up related to hypertension.  Past Medical History    Past Medical History:  Diagnosis Date  . Erectile dysfunction   . Hyperlipidemia   . Hypertension   . Injury by electrocution   . Injury of shoulder   . LBBB (left bundle branch block)    Chronic  . Nonischemic cardiomyopathy (HCC)    a. 05/2013: echo w/ EF of 35-40% b. 06/2013: cath with mild luminal irregularities with no significant CAD. Mild inferior wall HK.  . Obesity   . Obstructive sleep apnea    a. on CPAP   Past Surgical History:  Procedure Laterality Date  . APPENDECTOMY    . CARDIAC CATHETERIZATION  06/25/2013   [facility]; minor luminal irregularities, EF 45-50%;  Marland Kitchen KNEE SURGERY    . KNEE SURGERY     meniscus  . LUMBAR DISC SURGERY    . SHOULDER SURGERY    . UMBILICAL HERNIA REPAIR    . WRIST SURGERY     right    Allergies  No Known Allergies  History of Present Illness    63 year old male with the above past medical history including nonischemic cardiomyopathy dating back to at least 2014, at which time echocardiogram showed an EF of 35 to 40%.  Catheterization showed mild luminal irregularities.  Other history includes hypertension, hyperlipidemia, erectile dysfunction, morbid obesity, alcohol abuse, and sleep apnea.  He was last seen in clinic in January 2019, at which time he was doing well.  He recently underwent a routine physical with primary care and reported polyuria throughout the day, which has been attributed to his losartan-HCTZ.  Patient would like to try and come off of the diuretic to see if his urinary symptoms improved.  He does indicate that he  drinks frequently throughout the day and notes that he will usually have to urinate within 30 minutes of having a can of soda.  He has gained about 30 pounds over the past several months in the setting of inactivity secondary to right knee pain.  He has been seen by orthopedics and has been receiving injections.  He is disappointed that is not been able to be as active as usual.  He denies chest pain, palpitations, dyspnea, dizziness, syncope, edema, or early satiety.  Home Medications    Prior to Admission medications   Medication Sig Start Date End Date Taking? Authorizing Provider  aspirin EC 81 MG tablet Take 1 tablet (81 mg total) by mouth daily. 07/07/13  Yes Arguello, Roger A, PA-C  carvedilol (COREG) 6.25 MG tablet TAKE 1 TABLET BY MOUTH TWICE DAILY WITH A MEAL 01/19/19  Yes Gollan, Tollie Pizza, MD  doxazosin (CARDURA) 8 MG tablet TAKE 1 TABLET BY MOUTH EVERY DAY 09/21/16  Yes Gollan, Tollie Pizza, MD  losartan-hydrochlorothiazide (HYZAAR) 100-25 MG tablet TAKE 1 TABLET BY MOUTH DAILY 01/19/19  Yes Gollan, Tollie Pizza, MD  Omega-3 Fatty Acids (FISH OIL) 1200 MG CAPS Take by mouth daily. Takes 3 tablets daily   Yes [provider]  tadalafil (CIALIS) 20 MG tablet Take 1 tablet (20 mg total) by mouth daily as needed for erectile dysfunction. 08/29/16  Yes Strader, Lennart Pall, PA-C  Review of Systems    He denies chest pain, palpitations, dyspnea, pnd, orthopnea, n, v, dizziness, syncope, edema, weight gain, or early satiety.  Activity has been limited secondary to right knee pain.  All other systems reviewed and are otherwise negative except as noted above.  Physical Exam    VS:  BP (!) 160/96 (BP Location: Left Arm, Patient Position: Sitting, Cuff Size: Normal)   Pulse (!) 59   Ht 6\' 1"  (1.854 m)   Wt 274 lb (124.3 kg)   BMI 36.15 kg/m  , BMI Body mass index is 36.15 kg/m. GEN: Obese, in no acute distress. HEENT: normal. Neck: Supple, no JVD, carotid bruits, or masses. Cardiac: RRR,  no murmurs, rubs, or gallops. No clubbing, cyanosis, edema.  Radials/DP/PT 2+ and equal bilaterally.  Respiratory:  Respirations regular and unlabored, clear to auscultation bilaterally. GI: Soft, nontender, nondistended, BS + x 4. MS: no deformity or atrophy. Skin: warm and dry, no rash. Neuro:  Strength and sensation are intact. Psych: Normal affect.  Accessory Clinical Findings    ECG personally reviewed by me today -sinus bradycardia/arrhythmia, left bundle branch block- no acute changes.  Assessment & Plan    1.  Nonischemic cardiomyopathy: Previous evaluation 2014 with echocardiogram showing an EF of 35 to 40% and catheterization revealing minimal luminal irregularities.  He has been medically managed with beta-blocker and ARB therapy.  He has had weight gain in the setting of inactivity but denies any heart failure related symptoms.  He would like to come off of diuretic therapy due to polyuria throughout the day.  I will change him from losartan HCTZ to losartan alone.  As his blood pressure is elevated, I am going to add low-dose amlodipine as well and otherwise continue beta-blocker.  He is aware, that if he were to develop lower extremity swelling, we would likely have to add back diuretic.  He continues to drink heavily, saying that he drinks his fair share on a daily basis.  We discussed that this is likely contributing to his cardiomyopathy and complete alcohol cessation has been advised.  2.  Essential hypertension: Blood pressure elevated today.  He does not routinely check this at home but does own a cuff.  I am adding amlodipine 5 mg daily.  We discussed that this may contribute to lower extremity swelling, though this has not typically been an issue in the past for him.  I have asked him to follow his blood pressure at home over the next 2 weeks and contact us with recordings so that we can decide if we need to either titrate amlodipine or resume diuretic therapy if swelling were  to develop.  He otherwise remains on beta-blocker and doxazosin therapy.  3.  Mixed hyperlipidemia: Recent lipids with a total cholesterol of 200, HDL 58, LDL of 120.  His ten-year risk of cardiac event calculates out at 30.3%.  In that setting, he would ideally be treated with statin therapy.  He thinks he was previously on Lipitor and developed significant myalgias but would be willing to try an alternate statin.  As we are changing his hypertensive medications today, I advised that we will hold off on prescribing rosuvastatin but we can discuss further when he calls back with blood pressure readings and response to medications in approximately 2 weeks.  4.  Alcohol abuse: Continues to drink fairly heavily.  Complete cessation advised.  5.  Morbid obesity: Complicated currently secondary to right knee pain requiring injections and orthopedic evaluation.  Activity has been limited in that setting.  6.  Erectile dysfunction: Refilling Cialis today.  7.  Disposition: Patient will follow his blood pressures at home over the next 2 weeks and contact us with recordings.  Plan to follow-up in clinic in 1 year with Dr. Mariah Milling.   Nicolasa Ducking, NP 02/06/2019, 1:14 PM

## 2019-02-10 NOTE — Telephone Encounter (Signed)
PA was Denied for Tadalafil Denied on February 21

## 2019-02-11 NOTE — Telephone Encounter (Signed)
Call to [facility] for more information on prior auth denial. They had no further information to give at this time.  Called [employer] for further information the reported that medication had been denied because he has not tried 2 medications prior to this dose.   Letter drafted, will call pt first to verify that medication has been paid by [employer] rather than self pay.   "Dear Casey Hess representative;  Mr. Zelada (Key: [id number]) was ordered refill of medication, Tadalafil 1 tablet (20 mg total) as needed for erectile dysfunction on 02/06/19 by Ward Givens, NP.  His prior authorization for refill has been denied at this time. Telephone representative reported that it had been denied at this time due to not trying other medications prior including Tadalafil 5 mg. According to our records he tried Tadalafil 5 mg from 07/17/12-09/14/15. His dose was increased by Dr. Mariah Milling to 20 mg on 09/14/15 and has been using that dose PRN since.   Please consider this information in your decision to authorize his medication.    Thank you for your assistance,    Efrain Sella, RN "

## 2019-02-12 NOTE — Telephone Encounter (Signed)
Faxed appeal letter to [employer]  

## 2019-02-12 NOTE — Telephone Encounter (Signed)
Call to patient to confirm that [employer] has been covering cialis historically. He confirmed that they had been covering it.   I made patient aware that I will fax appeal letter and let him kniow of results.

## 2019-02-16 ENCOUNTER — Other Ambulatory Visit: Payer: Self-pay | Admitting: Cardiovascular Disease

## 2019-02-17 DIAGNOSIS — D519 Vitamin B12 deficiency anemia, unspecified: Secondary | ICD-10-CM | POA: Diagnosis not present

## 2019-02-18 NOTE — Telephone Encounter (Signed)
Received fax from [facility] Number [id number]  Tadalafil 20MG  is approved 02/06/2019 through 20/20/2023

## 2019-02-18 NOTE — Telephone Encounter (Signed)
Cal to patient to make him aware of approval. Pt had no further questions at this time. I offered to mail him a copy and pt refused. I told him we will add it to his file.   Advised pt to call for any further questions or concerns.

## 2019-02-20 DIAGNOSIS — M1711 Unilateral primary osteoarthritis, right knee: Secondary | ICD-10-CM | POA: Diagnosis not present

## 2019-02-24 ENCOUNTER — Telehealth: Payer: Self-pay | Admitting: Cardiovascular Disease

## 2019-02-24 DIAGNOSIS — M5136 Other intervertebral disc degeneration, lumbar region: Secondary | ICD-10-CM | POA: Diagnosis not present

## 2019-02-24 NOTE — Telephone Encounter (Signed)
   [facility] Pre-operative Risk Assessment    Request for surgical clearance:  1. What type of surgery is being performed?  RIGHT TOTAL KNEE  2. When is this surgery scheduled? 03/31/2019  3. What type of clearance is required (medical clearance vs. Pharmacy clearance to hold med vs. Both)? NOT LISTED  4. Are there any medications that need to be held prior to surgery and how long?NOT LISTED  5. Practice name and name of physician performing surgery?  [facility], DR ALLISIO  6. What is your office phone [phone]   7.   What is your office fax numbeR [phone]  8.   Anesthesia type (None, local, MAC, general) ? CHOICE   Lucienne Minks 02/24/2019, 4:03 PM  _________________________________________________________________   (provider comments below)

## 2019-02-24 NOTE — Telephone Encounter (Signed)
   Primary Cardiologist: Julien Nordmann, MD  Chart reviewed as part of pre-operative protocol coverage. Given past medical history and time since last visit, based on ACC/AHA guidelines, Casey Hess. would be at acceptable risk for the planned procedure without further cardiovascular testing. He does have a history of nonischemic cardiomyopathy and hypertensive heart disease.  Volume status will need to be evaluated closely throughout the perioperative period.  Please call with questions.  Nicolasa Ducking, NP 02/24/2019, 6:12 PM

## 2019-02-24 NOTE — Telephone Encounter (Signed)
Casey Hess, If pt is stable when I call would he be acceptable risk for total knee?  Since you just evaluated him would like your input. Thanks.

## 2019-03-09 DIAGNOSIS — M25561 Pain in right knee: Secondary | ICD-10-CM | POA: Diagnosis not present

## 2019-03-10 DIAGNOSIS — M5136 Other intervertebral disc degeneration, lumbar region: Secondary | ICD-10-CM | POA: Diagnosis not present

## 2019-03-10 DIAGNOSIS — M48061 Spinal stenosis, lumbar region without neurogenic claudication: Secondary | ICD-10-CM | POA: Diagnosis not present

## 2019-03-10 DIAGNOSIS — Z01818 Encounter for other preprocedural examination: Secondary | ICD-10-CM | POA: Diagnosis not present

## 2019-03-10 DIAGNOSIS — Z79899 Other long term (current) drug therapy: Secondary | ICD-10-CM | POA: Diagnosis not present

## 2019-03-10 DIAGNOSIS — G894 Chronic pain syndrome: Secondary | ICD-10-CM | POA: Diagnosis not present

## 2019-03-24 DIAGNOSIS — M5136 Other intervertebral disc degeneration, lumbar region: Secondary | ICD-10-CM | POA: Diagnosis not present

## 2019-03-26 DIAGNOSIS — E538 Deficiency of other specified B group vitamins: Secondary | ICD-10-CM | POA: Diagnosis not present

## 2019-04-22 DIAGNOSIS — D510 Vitamin B12 deficiency anemia due to intrinsic factor deficiency: Secondary | ICD-10-CM | POA: Diagnosis not present

## 2019-05-05 DIAGNOSIS — Z7901 Long term (current) use of anticoagulants: Secondary | ICD-10-CM | POA: Diagnosis not present

## 2019-05-05 DIAGNOSIS — M1711 Unilateral primary osteoarthritis, right knee: Secondary | ICD-10-CM | POA: Diagnosis not present

## 2019-05-12 DIAGNOSIS — Z96651 Presence of right artificial knee joint: Secondary | ICD-10-CM | POA: Diagnosis not present

## 2019-05-12 DIAGNOSIS — M1711 Unilateral primary osteoarthritis, right knee: Secondary | ICD-10-CM | POA: Diagnosis not present

## 2019-05-14 DIAGNOSIS — M25561 Pain in right knee: Secondary | ICD-10-CM | POA: Diagnosis not present

## 2019-05-14 DIAGNOSIS — R2689 Other abnormalities of gait and mobility: Secondary | ICD-10-CM | POA: Diagnosis not present

## 2019-05-14 DIAGNOSIS — Z96651 Presence of right artificial knee joint: Secondary | ICD-10-CM | POA: Diagnosis not present

## 2019-05-14 DIAGNOSIS — M62551 Muscle wasting and atrophy, not elsewhere classified, right thigh: Secondary | ICD-10-CM | POA: Diagnosis not present

## 2019-05-14 DIAGNOSIS — M1711 Unilateral primary osteoarthritis, right knee: Secondary | ICD-10-CM | POA: Diagnosis not present

## 2019-05-18 DIAGNOSIS — M62551 Muscle wasting and atrophy, not elsewhere classified, right thigh: Secondary | ICD-10-CM | POA: Diagnosis not present

## 2019-05-18 DIAGNOSIS — M25561 Pain in right knee: Secondary | ICD-10-CM | POA: Diagnosis not present

## 2019-05-18 DIAGNOSIS — R2689 Other abnormalities of gait and mobility: Secondary | ICD-10-CM | POA: Diagnosis not present

## 2019-05-18 DIAGNOSIS — Z96651 Presence of right artificial knee joint: Secondary | ICD-10-CM | POA: Diagnosis not present

## 2019-05-18 DIAGNOSIS — M1711 Unilateral primary osteoarthritis, right knee: Secondary | ICD-10-CM | POA: Diagnosis not present

## 2019-05-20 DIAGNOSIS — Z96651 Presence of right artificial knee joint: Secondary | ICD-10-CM | POA: Diagnosis not present

## 2019-05-20 DIAGNOSIS — M1711 Unilateral primary osteoarthritis, right knee: Secondary | ICD-10-CM | POA: Diagnosis not present

## 2019-05-20 DIAGNOSIS — M62551 Muscle wasting and atrophy, not elsewhere classified, right thigh: Secondary | ICD-10-CM | POA: Diagnosis not present

## 2019-05-20 DIAGNOSIS — M25561 Pain in right knee: Secondary | ICD-10-CM | POA: Diagnosis not present

## 2019-05-20 DIAGNOSIS — R2689 Other abnormalities of gait and mobility: Secondary | ICD-10-CM | POA: Diagnosis not present

## 2019-05-21 DIAGNOSIS — E538 Deficiency of other specified B group vitamins: Secondary | ICD-10-CM | POA: Diagnosis not present

## 2019-05-22 DIAGNOSIS — M1711 Unilateral primary osteoarthritis, right knee: Secondary | ICD-10-CM | POA: Diagnosis not present

## 2019-05-22 DIAGNOSIS — M62551 Muscle wasting and atrophy, not elsewhere classified, right thigh: Secondary | ICD-10-CM | POA: Diagnosis not present

## 2019-05-22 DIAGNOSIS — R2689 Other abnormalities of gait and mobility: Secondary | ICD-10-CM | POA: Diagnosis not present

## 2019-05-22 DIAGNOSIS — Z96651 Presence of right artificial knee joint: Secondary | ICD-10-CM | POA: Diagnosis not present

## 2019-05-22 DIAGNOSIS — M25561 Pain in right knee: Secondary | ICD-10-CM | POA: Diagnosis not present

## 2019-05-25 DIAGNOSIS — D649 Anemia, unspecified: Secondary | ICD-10-CM | POA: Diagnosis not present

## 2019-05-25 DIAGNOSIS — M25561 Pain in right knee: Secondary | ICD-10-CM | POA: Diagnosis not present

## 2019-05-25 DIAGNOSIS — M62551 Muscle wasting and atrophy, not elsewhere classified, right thigh: Secondary | ICD-10-CM | POA: Diagnosis not present

## 2019-05-25 DIAGNOSIS — Z96651 Presence of right artificial knee joint: Secondary | ICD-10-CM | POA: Diagnosis not present

## 2019-05-25 DIAGNOSIS — R05 Cough: Secondary | ICD-10-CM | POA: Diagnosis not present

## 2019-05-25 DIAGNOSIS — Z6835 Body mass index (BMI) 35.0-35.9, adult: Secondary | ICD-10-CM | POA: Diagnosis not present

## 2019-05-25 DIAGNOSIS — M1711 Unilateral primary osteoarthritis, right knee: Secondary | ICD-10-CM | POA: Diagnosis not present

## 2019-05-25 DIAGNOSIS — R2689 Other abnormalities of gait and mobility: Secondary | ICD-10-CM | POA: Diagnosis not present

## 2019-05-25 DIAGNOSIS — I10 Essential (primary) hypertension: Secondary | ICD-10-CM | POA: Diagnosis not present

## 2019-05-27 DIAGNOSIS — R2689 Other abnormalities of gait and mobility: Secondary | ICD-10-CM | POA: Diagnosis not present

## 2019-05-27 DIAGNOSIS — M25561 Pain in right knee: Secondary | ICD-10-CM | POA: Diagnosis not present

## 2019-05-27 DIAGNOSIS — M1711 Unilateral primary osteoarthritis, right knee: Secondary | ICD-10-CM | POA: Diagnosis not present

## 2019-05-27 DIAGNOSIS — M62551 Muscle wasting and atrophy, not elsewhere classified, right thigh: Secondary | ICD-10-CM | POA: Diagnosis not present

## 2019-05-27 DIAGNOSIS — Z96651 Presence of right artificial knee joint: Secondary | ICD-10-CM | POA: Diagnosis not present

## 2019-05-28 DIAGNOSIS — D649 Anemia, unspecified: Secondary | ICD-10-CM | POA: Diagnosis not present

## 2019-05-29 DIAGNOSIS — M62551 Muscle wasting and atrophy, not elsewhere classified, right thigh: Secondary | ICD-10-CM | POA: Diagnosis not present

## 2019-05-29 DIAGNOSIS — M25561 Pain in right knee: Secondary | ICD-10-CM | POA: Diagnosis not present

## 2019-05-29 DIAGNOSIS — R2689 Other abnormalities of gait and mobility: Secondary | ICD-10-CM | POA: Diagnosis not present

## 2019-05-29 DIAGNOSIS — Z96651 Presence of right artificial knee joint: Secondary | ICD-10-CM | POA: Diagnosis not present

## 2019-05-29 DIAGNOSIS — M1711 Unilateral primary osteoarthritis, right knee: Secondary | ICD-10-CM | POA: Diagnosis not present

## 2019-06-01 DIAGNOSIS — M25561 Pain in right knee: Secondary | ICD-10-CM | POA: Diagnosis not present

## 2019-06-01 DIAGNOSIS — Z96651 Presence of right artificial knee joint: Secondary | ICD-10-CM | POA: Diagnosis not present

## 2019-06-01 DIAGNOSIS — M62551 Muscle wasting and atrophy, not elsewhere classified, right thigh: Secondary | ICD-10-CM | POA: Diagnosis not present

## 2019-06-01 DIAGNOSIS — M1711 Unilateral primary osteoarthritis, right knee: Secondary | ICD-10-CM | POA: Diagnosis not present

## 2019-06-01 DIAGNOSIS — R2689 Other abnormalities of gait and mobility: Secondary | ICD-10-CM | POA: Diagnosis not present

## 2019-06-04 DIAGNOSIS — R2689 Other abnormalities of gait and mobility: Secondary | ICD-10-CM | POA: Diagnosis not present

## 2019-06-04 DIAGNOSIS — M62551 Muscle wasting and atrophy, not elsewhere classified, right thigh: Secondary | ICD-10-CM | POA: Diagnosis not present

## 2019-06-04 DIAGNOSIS — M1711 Unilateral primary osteoarthritis, right knee: Secondary | ICD-10-CM | POA: Diagnosis not present

## 2019-06-04 DIAGNOSIS — M25561 Pain in right knee: Secondary | ICD-10-CM | POA: Diagnosis not present

## 2019-06-04 DIAGNOSIS — Z96651 Presence of right artificial knee joint: Secondary | ICD-10-CM | POA: Diagnosis not present

## 2019-06-08 DIAGNOSIS — D649 Anemia, unspecified: Secondary | ICD-10-CM | POA: Diagnosis not present

## 2019-06-09 DIAGNOSIS — Z96651 Presence of right artificial knee joint: Secondary | ICD-10-CM | POA: Diagnosis not present

## 2019-06-09 DIAGNOSIS — M62551 Muscle wasting and atrophy, not elsewhere classified, right thigh: Secondary | ICD-10-CM | POA: Diagnosis not present

## 2019-06-09 DIAGNOSIS — R2689 Other abnormalities of gait and mobility: Secondary | ICD-10-CM | POA: Diagnosis not present

## 2019-06-09 DIAGNOSIS — M25561 Pain in right knee: Secondary | ICD-10-CM | POA: Diagnosis not present

## 2019-06-09 DIAGNOSIS — M1711 Unilateral primary osteoarthritis, right knee: Secondary | ICD-10-CM | POA: Diagnosis not present

## 2019-06-11 DIAGNOSIS — M62551 Muscle wasting and atrophy, not elsewhere classified, right thigh: Secondary | ICD-10-CM | POA: Diagnosis not present

## 2019-06-11 DIAGNOSIS — Z96651 Presence of right artificial knee joint: Secondary | ICD-10-CM | POA: Diagnosis not present

## 2019-06-11 DIAGNOSIS — R2689 Other abnormalities of gait and mobility: Secondary | ICD-10-CM | POA: Diagnosis not present

## 2019-06-11 DIAGNOSIS — M25561 Pain in right knee: Secondary | ICD-10-CM | POA: Diagnosis not present

## 2019-06-11 DIAGNOSIS — M1711 Unilateral primary osteoarthritis, right knee: Secondary | ICD-10-CM | POA: Diagnosis not present

## 2019-06-25 DIAGNOSIS — R2689 Other abnormalities of gait and mobility: Secondary | ICD-10-CM | POA: Diagnosis not present

## 2019-06-25 DIAGNOSIS — Z471 Aftercare following joint replacement surgery: Secondary | ICD-10-CM | POA: Diagnosis not present

## 2019-06-25 DIAGNOSIS — M1711 Unilateral primary osteoarthritis, right knee: Secondary | ICD-10-CM | POA: Diagnosis not present

## 2019-06-25 DIAGNOSIS — M25561 Pain in right knee: Secondary | ICD-10-CM | POA: Diagnosis not present

## 2019-06-25 DIAGNOSIS — M62551 Muscle wasting and atrophy, not elsewhere classified, right thigh: Secondary | ICD-10-CM | POA: Diagnosis not present

## 2019-06-25 DIAGNOSIS — Z96651 Presence of right artificial knee joint: Secondary | ICD-10-CM | POA: Diagnosis not present

## 2019-06-26 DIAGNOSIS — D519 Vitamin B12 deficiency anemia, unspecified: Secondary | ICD-10-CM | POA: Diagnosis not present

## 2019-06-29 DIAGNOSIS — D649 Anemia, unspecified: Secondary | ICD-10-CM | POA: Diagnosis not present

## 2019-07-08 DIAGNOSIS — D509 Iron deficiency anemia, unspecified: Secondary | ICD-10-CM | POA: Diagnosis not present

## 2019-07-15 DIAGNOSIS — D509 Iron deficiency anemia, unspecified: Secondary | ICD-10-CM | POA: Diagnosis not present

## 2019-08-05 ENCOUNTER — Other Ambulatory Visit: Payer: Self-pay

## 2019-08-05 ENCOUNTER — Telehealth: Payer: Self-pay

## 2019-08-05 MED ORDER — DOXAZOSIN MESYLATE 8 MG PO TABS: 8.0000 mg | ORAL_TABLET | Freq: Every day | ORAL | 5 refills | Status: DC

## 2019-08-05 MED ORDER — CARVEDILOL 6.25 MG PO TABS: ORAL_TABLET | ORAL | 5 refills | Status: DC

## 2019-08-05 NOTE — Telephone Encounter (Signed)
Refill sent for Carvedilol.  

## 2019-08-25 DIAGNOSIS — D519 Vitamin B12 deficiency anemia, unspecified: Secondary | ICD-10-CM | POA: Diagnosis not present

## 2019-08-25 DIAGNOSIS — Z0000 Encounter for general adult medical examination without abnormal findings: Secondary | ICD-10-CM | POA: Diagnosis not present

## 2019-09-17 DIAGNOSIS — D519 Vitamin B12 deficiency anemia, unspecified: Secondary | ICD-10-CM | POA: Diagnosis not present

## 2019-10-06 DIAGNOSIS — D509 Iron deficiency anemia, unspecified: Secondary | ICD-10-CM | POA: Diagnosis not present

## 2019-10-09 DIAGNOSIS — M545 Low back pain: Secondary | ICD-10-CM | POA: Diagnosis not present

## 2019-10-09 DIAGNOSIS — M5416 Radiculopathy, lumbar region: Secondary | ICD-10-CM | POA: Diagnosis not present

## 2019-10-28 DIAGNOSIS — D510 Vitamin B12 deficiency anemia due to intrinsic factor deficiency: Secondary | ICD-10-CM | POA: Diagnosis not present

## 2019-11-03 DIAGNOSIS — M5136 Other intervertebral disc degeneration, lumbar region: Secondary | ICD-10-CM | POA: Diagnosis not present

## 2019-11-09 DIAGNOSIS — D1801 Hemangioma of skin and subcutaneous tissue: Secondary | ICD-10-CM | POA: Diagnosis not present

## 2019-11-09 DIAGNOSIS — L728 Other follicular cysts of the skin and subcutaneous tissue: Secondary | ICD-10-CM | POA: Diagnosis not present

## 2019-11-09 DIAGNOSIS — L578 Other skin changes due to chronic exposure to nonionizing radiation: Secondary | ICD-10-CM | POA: Diagnosis not present

## 2019-11-09 DIAGNOSIS — R233 Spontaneous ecchymoses: Secondary | ICD-10-CM | POA: Diagnosis not present

## 2019-11-09 DIAGNOSIS — L570 Actinic keratosis: Secondary | ICD-10-CM | POA: Diagnosis not present

## 2019-11-27 DIAGNOSIS — D519 Vitamin B12 deficiency anemia, unspecified: Secondary | ICD-10-CM | POA: Diagnosis not present

## 2019-12-09 DIAGNOSIS — Z20828 Contact with and (suspected) exposure to other viral communicable diseases: Secondary | ICD-10-CM | POA: Diagnosis not present

## 2019-12-09 DIAGNOSIS — K921 Melena: Secondary | ICD-10-CM | POA: Diagnosis not present

## 2019-12-15 DIAGNOSIS — M5136 Other intervertebral disc degeneration, lumbar region: Secondary | ICD-10-CM | POA: Diagnosis not present

## 2019-12-25 DIAGNOSIS — D519 Vitamin B12 deficiency anemia, unspecified: Secondary | ICD-10-CM | POA: Diagnosis not present

## 2019-12-29 DIAGNOSIS — M48061 Spinal stenosis, lumbar region without neurogenic claudication: Secondary | ICD-10-CM | POA: Diagnosis not present

## 2019-12-29 DIAGNOSIS — M5136 Other intervertebral disc degeneration, lumbar region: Secondary | ICD-10-CM | POA: Diagnosis not present

## 2020-01-18 DIAGNOSIS — D519 Vitamin B12 deficiency anemia, unspecified: Secondary | ICD-10-CM | POA: Diagnosis not present

## 2020-01-20 DIAGNOSIS — Z96651 Presence of right artificial knee joint: Secondary | ICD-10-CM | POA: Diagnosis not present

## 2020-01-20 DIAGNOSIS — Z471 Aftercare following joint replacement surgery: Secondary | ICD-10-CM | POA: Diagnosis not present

## 2020-01-25 ENCOUNTER — Other Ambulatory Visit: Payer: Self-pay

## 2020-01-25 MED ORDER — AMLODIPINE BESYLATE 5 MG PO TABS: 5.0000 mg | ORAL_TABLET | Freq: Every day | ORAL | 0 refills | Status: DC

## 2020-01-25 MED ORDER — LOSARTAN POTASSIUM 100 MG PO TABS: 100.0000 mg | ORAL_TABLET | Freq: Every day | ORAL | 0 refills | Status: DC

## 2020-01-29 DIAGNOSIS — Z23 Encounter for immunization: Secondary | ICD-10-CM | POA: Diagnosis not present

## 2020-02-08 ENCOUNTER — Other Ambulatory Visit: Payer: Self-pay | Admitting: *Deleted

## 2020-02-08 MED ORDER — DOXAZOSIN MESYLATE 8 MG PO TABS: 8.0000 mg | ORAL_TABLET | Freq: Every day | ORAL | 0 refills | Status: DC

## 2020-02-08 MED ORDER — CARVEDILOL 6.25 MG PO TABS: 6.2500 mg | ORAL_TABLET | Freq: Two times a day (BID) | ORAL | 0 refills | Status: DC

## 2020-02-08 NOTE — Telephone Encounter (Signed)
Requested Prescriptions   Signed Prescriptions Disp Refills  . doxazosin (CARDURA) 8 MG tablet 30 tablet 0    Sig: Take 1 tablet (8 mg total) by mouth daily.    Authorizing Provider: Creig Hines    Ordering User: Kairee Kozma C  . carvedilol (COREG) 6.25 MG tablet 60 tablet 0    Sig: Take 1 tablet (6.25 mg total) by mouth 2 (two) times daily with a meal.    Authorizing Provider: Creig Hines    Ordering User: Kendrick Fries

## 2020-02-16 DIAGNOSIS — Z23 Encounter for immunization: Secondary | ICD-10-CM | POA: Diagnosis not present

## 2020-02-18 DIAGNOSIS — E538 Deficiency of other specified B group vitamins: Secondary | ICD-10-CM | POA: Diagnosis not present

## 2020-02-26 DIAGNOSIS — Z0000 Encounter for general adult medical examination without abnormal findings: Secondary | ICD-10-CM | POA: Diagnosis not present

## 2020-02-26 DIAGNOSIS — Z125 Encounter for screening for malignant neoplasm of prostate: Secondary | ICD-10-CM | POA: Diagnosis not present

## 2020-02-26 DIAGNOSIS — Z1331 Encounter for screening for depression: Secondary | ICD-10-CM | POA: Diagnosis not present

## 2020-02-26 DIAGNOSIS — Z13220 Encounter for screening for lipoid disorders: Secondary | ICD-10-CM | POA: Diagnosis not present

## 2020-02-26 DIAGNOSIS — Z6835 Body mass index (BMI) 35.0-35.9, adult: Secondary | ICD-10-CM | POA: Diagnosis not present

## 2020-03-06 NOTE — Progress Notes (Signed)
Office Visit    Patient Name: Casey Hess. Date of Encounter: 03/06/2020  Primary Care Provider:  Noni Saupe, MD Primary Cardiologist:  Casey Nordmann, MD Electrophysiologist:  None   Chief Complaint    Casey Ringer. is a 64 y.o. male with a hx of NICM, HTN, HLD, etoh abuse, obesity, sleep apnea presents today for HTN follow up.   Past Medical History    Past Medical History:  Diagnosis Date  . Erectile dysfunction   . Hyperlipidemia   . Hypertension   . Injury by electrocution   . Injury of shoulder   . LBBB (left bundle branch block)    Chronic  . Nonischemic cardiomyopathy (HCC)    a. 05/2013: echo w/ EF of 35-40% b. 06/2013: cath with mild luminal irregularities with no significant CAD. Mild inferior wall HK.  . Obesity   . Obstructive sleep apnea    a. on CPAP   Past Surgical History:  Procedure Laterality Date  . APPENDECTOMY    . CARDIAC CATHETERIZATION  06/25/2013   [facility]; minor luminal irregularities, EF 45-50%;  Marland Kitchen KNEE SURGERY    . KNEE SURGERY     meniscus  . LUMBAR DISC SURGERY    . SHOULDER SURGERY    . UMBILICAL HERNIA REPAIR    . WRIST SURGERY     right    Allergies  No Known Allergies  History of Present Illness    Casey Hess. is a 64 y.o. male with a hx of NICM, HTN, HLD, etoh abuse, obesity, sleep apnea last seen 02/06/19 by Casey Givens, NP.  NICM dates back to at least 2014 with echo at that time with EF 35-40%. Catheterization with mild luminal irregularities. He has been managed with beta-blocker and ARB therapy. At last office visit HCTZ was discontinued at patients request due to polyuria, and Amlodipine was added. He has a history of myalgia with Atorvastatin.  Labs by PCP 02/26/2020 via [facility]:  ALT 21, AST 17, total cholesterol 113, HDL 31, LDL 72, triglycerides 49, hemoglobin 12, K3.6,  Since last seen he has undergone right TKA.  Is very pleased with results and tells me his mobility has much improved.  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 Weight down 14 pounds from 1 year ago.  Endorses eating heart healthy diet.  Eats mostly at home.  Tells me he gave up alcohol for lent but prior to this was drinking typically 3 drinks per night.  We discussed the role of alcoholic cardiomyopathy and encouraged continued cessation.  No chest pain, pressure, tightness.  No SLB nor DOE.  No orthopnea, PND, edema.  EKGs/Labs/Other Studies Reviewed:   The following studies were reviewed today:  EKG:  EKG is ordered today.  The ekg ordered today demonstrates sinus rhythm 65 bpm with occasional PAC and known left bundle branch block  Recent Labs: No results found for requested labs within last 8760 hours.  Recent Lipid Panel    Component Value Date/Time   CHOL 222 (H) 02/01/2010 2032   TRIG 111 02/01/2010 2032   HDL 48 02/01/2010 2032   CHOLHDL 4.6 Ratio 02/01/2010 2032   VLDL 22 02/01/2010 2032   LDLCALC 152 (H) 02/01/2010 2032    Home Medications   No outpatient medications have been marked as taking for the 03/07/20 encounter (Appointment) with Casey Sorrow, NP.      Review of Systems  Review of Systems  Constitution: Negative for chills, fever and malaise/fatigue.  Cardiovascular: Negative for chest pain, dyspnea on exertion, leg swelling, near-syncope, orthopnea, palpitations and syncope.  Respiratory: Negative for cough, shortness of breath and wheezing.   Gastrointestinal: Negative for nausea and vomiting.  Neurological: Negative for dizziness, light-headedness and weakness.   All other systems reviewed and are otherwise negative except as noted above.  Physical Exam    VS:  There were no vitals taken for this visit. , BMI There is no height or weight on file to calculate BMI. GEN: Well nourished, well developed, in no acute distress. HEENT: normal. Neck: Supple, no JVD, carotid bruits, or masses. Cardiac: RRR, no murmurs, rubs, or gallops. No  clubbing, cyanosis, edema.  Radials/DP/PT 2+ and equal bilaterally.  Respiratory:  Respirations regular and unlabored, clear to auscultation bilaterally. GI: Soft, nontender, nondistended, BS + x 4. MS: No deformity or atrophy. Skin: Warm and dry, no rash. Neuro:  Strength and sensation are intact. Psych: Normal affect.  Assessment & Plan    1. NICM -previous evaluation 2014 with echo showing LVEF 35-40% and catheterization revealing minimal luminal irregularities.  GDMT includes beta-blocker and ARB.  No indication for loop diuretic at this time as he is euvolemic and well compensated on exam.  Continue alcohol cessation encouraged.  Low-sodium, heart healthy diet encouraged.   2. HTN -blood pressure mildly elevated today 140/70.  Tells me primary care office visit recently systolic in the 382N.  BP goal less than 130/80.  Encouraged him to check at home and call us if blood pressure consistently greater than 130/80.  Refill provided today of Norvasc 5 mg, Coreg 6.25 twice daily, Cardura 8 mg daily, losartan 100 mg daily.  Recent renal function and electrolytes normal when checked by PCP.  If blood pressure continues to be elevated recommend up titration of amlodipine. 3. HLD -noted history.  Recent cholesterol panel 02/2020 by his primary care  total cholesterol 113, HDL 31, LDL 72, triglycerides 49.  Lipid-lowering diet recommended. 4. Alcohol use -recommend continued cessation of alcohol. 5. Morbid obesity -encouraged in a 14 pound weight loss from last year.  Continued physical activity and weight loss via diet and lifestyle changes encouraged. 6. Erectile dysfunction - Refill cialis today.   Disposition: Follow up in 1 year(s) with Dr. Wallie Renshaw, NP 03/06/2020, 8:10 PM

## 2020-03-07 ENCOUNTER — Other Ambulatory Visit: Payer: Self-pay

## 2020-03-07 ENCOUNTER — Ambulatory Visit (INDEPENDENT_AMBULATORY_CARE_PROVIDER_SITE_OTHER): Payer: BC Managed Care – PPO | Admitting: Family

## 2020-03-07 ENCOUNTER — Encounter: Payer: Self-pay | Admitting: Family

## 2020-03-07 VITALS — BP 140/70 | HR 64 | Ht 73.0 in | Wt 260.5 lb

## 2020-03-07 DIAGNOSIS — E782 Mixed hyperlipidemia: Secondary | ICD-10-CM | POA: Diagnosis not present

## 2020-03-07 DIAGNOSIS — N529 Male erectile dysfunction, unspecified: Secondary | ICD-10-CM | POA: Diagnosis not present

## 2020-03-07 DIAGNOSIS — I10 Essential (primary) hypertension: Secondary | ICD-10-CM

## 2020-03-07 DIAGNOSIS — I428 Other cardiomyopathies: Secondary | ICD-10-CM | POA: Diagnosis not present

## 2020-03-07 MED ORDER — LOSARTAN POTASSIUM 100 MG PO TABS: 100.0000 mg | ORAL_TABLET | Freq: Every day | ORAL | 0 refills | Status: DC

## 2020-03-07 MED ORDER — DOXAZOSIN MESYLATE 8 MG PO TABS: 8.0000 mg | ORAL_TABLET | Freq: Every day | ORAL | 0 refills | Status: DC

## 2020-03-07 MED ORDER — TADALAFIL 20 MG PO TABS: 20.0000 mg | ORAL_TABLET | Freq: Every day | ORAL | 3 refills | Status: DC | PRN

## 2020-03-07 MED ORDER — DOXAZOSIN MESYLATE 8 MG PO TABS: 8.0000 mg | ORAL_TABLET | Freq: Every day | ORAL | 3 refills | Status: DC

## 2020-03-07 MED ORDER — CARVEDILOL 6.25 MG PO TABS: 6.2500 mg | ORAL_TABLET | Freq: Two times a day (BID) | ORAL | 3 refills | Status: DC

## 2020-03-07 MED ORDER — CARVEDILOL 6.25 MG PO TABS: 6.2500 mg | ORAL_TABLET | Freq: Two times a day (BID) | ORAL | 0 refills | Status: DC

## 2020-03-07 MED ORDER — LOSARTAN POTASSIUM 100 MG PO TABS: 100.0000 mg | ORAL_TABLET | Freq: Every day | ORAL | 3 refills | Status: DC

## 2020-03-07 MED ORDER — AMLODIPINE BESYLATE 5 MG PO TABS: 5.0000 mg | ORAL_TABLET | Freq: Every day | ORAL | 3 refills | Status: DC

## 2020-03-07 NOTE — Patient Instructions (Addendum)
Medication Instructions:  No medication changes today.  *If you need a refill on your cardiac medications before your next appointment, please call your pharmacy*   Lab Work: None ordered today.   If you have labs (blood work) drawn today and your tests are completely normal, you will receive your results only by: Marland Kitchen MyChart Message (if you have MyChart) OR . A paper copy in the mail If you have any lab test that is abnormal or we need to change your treatment, we will call you to review the results.   Testing/Procedures: Your EKG showed sinus rhythm with known left bundle branch block.   Follow-Up: At [facility], you and your health needs are our priority.  As part of our continuing mission to provide you with exceptional heart care, we have created designated Provider Care Teams.  These Care Teams include your primary Cardiologist (physician) and Advanced Practice Providers (APPs -  Physician Assistants and Nurse Practitioners) who all work together to provide you with the care you need, when you need it.  We recommend signing up for the patient portal called "MyChart".  Sign up information is provided on this After Visit Summary.  MyChart is used to connect with patients for Virtual Visits (Telemedicine).  Patients are able to view lab/test results, encounter notes, upcoming appointments, etc.  Non-urgent messages can be sent to your provider as well.   To learn more about what you can do with MyChart, go to [email or web address].    Your next appointment:   1 year(s)  The format for your next appointment:   In Person  Provider:    You may see Julien Nordmann, MD or one of the following Advanced Practice Providers on your designated Care Team:    Nicolasa Ducking, NP  Eula Listen, PA-C  Marisue Ivan, PA-C  Other Instructions   Goal for blood pressure is less than 130/80   Fat and Cholesterol Restricted Eating Plan Getting too much fat and cholesterol in your  diet may cause health problems. Choosing the right foods helps keep your fat and cholesterol at normal levels. This can keep you from getting certain diseases. What are tips for following this plan? Meal planning  At meals, divide your plate into four equal parts: ? Fill one-half of your plate with vegetables and green salads. ? Fill one-fourth of your plate with whole grains. ? Fill one-fourth of your plate with low-fat (lean) protein foods.  Eat fish that is high in omega-3 fats at least two times a week. This includes mackerel, tuna, sardines, and salmon.  Eat foods that are high in fiber, such as whole grains, beans, apples, broccoli, carrots, peas, and barley. General tips   Work with your doctor to lose weight if you need to.  Avoid: ? Foods with added sugar. ? Fried foods. ? Foods with partially hydrogenated oils.  Limit alcohol intake to no more than 1 drink a day for nonpregnant women and 2 drinks a day for men. One drink equals 12 oz of beer, 5 oz of wine, or 1 oz of hard liquor. Reading food labels  Check food labels for: ? Trans fats. ? Partially hydrogenated oils. ? Saturated fat (g) in each serving. ? Cholesterol (mg) in each serving. ? Fiber (g) in each serving.  Choose foods with healthy fats, such as: ? Monounsaturated fats. ? Polyunsaturated fats. ? Omega-3 fats.  Choose grain products that have whole grains. Look for the word "whole" as the first word in the ingredient  list. Cooking  Cook foods using low-fat methods. These include baking, boiling, grilling, and broiling.  Eat more home-cooked foods. Eat at restaurants and buffets less often.  Avoid cooking using saturated fats, such as butter, cream, palm oil, palm kernel oil, and coconut oil. Recommended foods  Fruits  All fresh, canned (in natural juice), or frozen fruits. Vegetables  Fresh or frozen vegetables (raw, steamed, roasted, or grilled). Green salads. Grains  Whole grains, such as  whole wheat or whole grain breads, crackers, cereals, and pasta. Unsweetened oatmeal, bulgur, barley, quinoa, or brown rice. Corn or whole wheat flour tortillas. Meats and other protein foods  Ground beef (85% or leaner), grass-fed beef, or beef trimmed of fat. Skinless chicken or [place]. Ground chicken or [place]. Pork trimmed of fat. All fish and seafood. Egg whites. Dried beans, peas, or lentils. Unsalted nuts or seeds. Unsalted canned beans. Nut butters without added sugar or oil. Dairy  Low-fat or nonfat dairy products, such as skim or 1% milk, 2% or reduced-fat cheeses, low-fat and fat-free ricotta or cottage cheese, or plain low-fat and nonfat yogurt. Fats and oils  Tub margarine without trans fats. Light or reduced-fat mayonnaise and salad dressings. Avocado. Olive, canola, sesame, or safflower oils. The items listed above may not be a complete list of foods and beverages you can eat. Contact a dietitian for more information. Foods to avoid Fruits  Canned fruit in heavy syrup. Fruit in cream or butter sauce. Fried fruit. Vegetables  Vegetables cooked in cheese, cream, or butter sauce. Fried vegetables. Grains  White bread. White pasta. White rice. Cornbread. Bagels, pastries, and croissants. Crackers and snack foods that contain trans fat and hydrogenated oils. Meats and other protein foods  Fatty cuts of meat. Ribs, chicken wings, bacon, sausage, bologna, salami, chitterlings, fatback, hot dogs, bratwurst, and packaged lunch meats. Liver and organ meats. Whole eggs and egg yolks. Chicken and [place] with skin. Fried meat. Dairy  Whole or 2% milk, cream, half-and-half, and cream cheese. Whole milk cheeses. Whole-fat or sweetened yogurt. Full-fat cheeses. Nondairy creamers and whipped toppings. Processed cheese, cheese spreads, and cheese curds. Beverages  Alcohol. Sugar-sweetened drinks such as sodas, lemonade, and fruit drinks. Fats and oils  Butter, stick margarine, lard,  shortening, ghee, or bacon fat. Coconut, palm kernel, and palm oils. Sweets and desserts  Corn syrup, sugars, honey, and molasses. Candy. Jam and jelly. Syrup. Sweetened cereals. Cookies, pies, cakes, donuts, muffins, and ice cream. The items listed above may not be a complete list of foods and beverages you should avoid. Contact a dietitian for more information. Summary  Choosing the right foods helps keep your fat and cholesterol at normal levels. This can keep you from getting certain diseases.  At meals, fill one-half of your plate with vegetables and green salads.  Eat high-fiber foods, like whole grains, beans, apples, carrots, peas, and barley.  Limit added sugar, saturated fats, alcohol, and fried foods. This information is not intended to replace advice given to you by your health care provider. Make sure you discuss any questions you have with your health care provider. Document Revised: 08/06/2018 Document Reviewed: 08/20/2017 Elsevier Patient Education  [place].

## 2020-03-22 DIAGNOSIS — E538 Deficiency of other specified B group vitamins: Secondary | ICD-10-CM | POA: Diagnosis not present

## 2020-04-19 DIAGNOSIS — E538 Deficiency of other specified B group vitamins: Secondary | ICD-10-CM | POA: Diagnosis not present

## 2020-04-19 DIAGNOSIS — D510 Vitamin B12 deficiency anemia due to intrinsic factor deficiency: Secondary | ICD-10-CM | POA: Diagnosis not present

## 2020-04-25 ENCOUNTER — Other Ambulatory Visit: Payer: Self-pay | Admitting: *Deleted

## 2020-04-25 DIAGNOSIS — I10 Essential (primary) hypertension: Secondary | ICD-10-CM

## 2020-04-25 MED ORDER — AMLODIPINE BESYLATE 5 MG PO TABS: 5.0000 mg | ORAL_TABLET | Freq: Every day | ORAL | 3 refills | Status: DC

## 2020-05-23 DIAGNOSIS — D510 Vitamin B12 deficiency anemia due to intrinsic factor deficiency: Secondary | ICD-10-CM | POA: Diagnosis not present

## 2020-06-16 DIAGNOSIS — Z96651 Presence of right artificial knee joint: Secondary | ICD-10-CM | POA: Diagnosis not present

## 2020-06-16 DIAGNOSIS — Z471 Aftercare following joint replacement surgery: Secondary | ICD-10-CM | POA: Diagnosis not present

## 2020-07-05 DIAGNOSIS — D519 Vitamin B12 deficiency anemia, unspecified: Secondary | ICD-10-CM | POA: Diagnosis not present

## 2020-07-21 DIAGNOSIS — M5416 Radiculopathy, lumbar region: Secondary | ICD-10-CM | POA: Diagnosis not present

## 2020-07-26 DIAGNOSIS — D510 Vitamin B12 deficiency anemia due to intrinsic factor deficiency: Secondary | ICD-10-CM | POA: Diagnosis not present

## 2020-08-17 DIAGNOSIS — D519 Vitamin B12 deficiency anemia, unspecified: Secondary | ICD-10-CM | POA: Diagnosis not present

## 2020-08-19 DIAGNOSIS — J00 Acute nasopharyngitis [common cold]: Secondary | ICD-10-CM | POA: Diagnosis not present

## 2020-08-19 DIAGNOSIS — Z20822 Contact with and (suspected) exposure to covid-19: Secondary | ICD-10-CM | POA: Diagnosis not present

## 2021-03-01 ENCOUNTER — Encounter: Payer: Self-pay | Admitting: Cardiovascular Disease

## 2021-03-03 NOTE — Progress Notes (Signed)
Ejection fraction 35% by echocardiogram 2014 up to cardiology Office Note  Date:  03/06/2021   ID:  Casey Ringer., DOB June 10, 1956, MRN [id number]  PCP:  Noni Saupe, MD   Chief Complaint  Patient presents with  . Follow-up    12 month F/U    HPI:  Casey Gougeon. is a 65 y.o. male with PMHx of HTN,  HLD,  Smoker, not cig significant alcohol history drinking at least 6 beers per day ,  erectile dysfunction,  OSA who wears CPAP , obesity  cardiac catheterization with no significant disease in 2014 Echocardiogram 2014 ejection fraction 35-40% Cardiac catheterization 2014 ejection fraction 45-50% No study since that time who presents today for follow-up of his hypertension.  Last seen in clinic by myself January 2019 Seen by one of our providers February 2020  In follow-up today he reports that he continues to work hard, Administrator, more supervision, less labor Has some back issues  Statin myalgias, lipitor Reports that primary care would like him to be on a cholesterol medication At recent lab work, has not received a phone call on results  Prior office visits, drinking beer quite frequently Denies chest pain, shortness of breath on exertion Weight staying high No regular exercise program  Non-smoker no diabetes,   EKG on today's visit shows normal sinus rhythm with rate 79 bpm, left bundle branch block, PACs noted No change in EKG  Other past medical history  Previous stress test in 2012 which revealed no evidenced of ischemia, but reduced EF.  A follow-up 2D echo indicated EF 50-55% with mildly dilated LV and mild LVH, otherwise normal study.     PMH:   has a past medical history of Erectile dysfunction, Hyperlipidemia, Hypertension, Injury by electrocution, Injury of shoulder, LBBB (left bundle branch block), Nonischemic cardiomyopathy (HCC), Obesity, and Obstructive sleep apnea.  PSH:    Past Surgical History:  Procedure Laterality Date  .  APPENDECTOMY    . CARDIAC CATHETERIZATION  06/25/2013   [facility]; minor luminal irregularities, EF 45-50%;  Marland Kitchen KNEE SURGERY    . KNEE SURGERY     meniscus  . LUMBAR DISC SURGERY    . SHOULDER SURGERY    . UMBILICAL HERNIA REPAIR    . WRIST SURGERY     right    Current Outpatient Medications  Medication Sig Dispense Refill  . amLODipine (NORVASC) 5 MG tablet Take 1 tablet (5 mg total) by mouth daily. 90 tablet 3  . aspirin 81 MG chewable tablet Chew 81 mg by mouth daily.    . carvedilol (COREG) 6.25 MG tablet Take 1 tablet (6.25 mg total) by mouth 2 (two) times daily with a meal. 180 tablet 3  . doxazosin (CARDURA) 8 MG tablet Take 1 tablet (8 mg total) by mouth daily. 90 tablet 3  . losartan (COZAAR) 100 MG tablet Take 1 tablet (100 mg total) by mouth daily. 90 tablet 3  . methocarbamol (ROBAXIN) 500 MG tablet Take 500 mg by mouth in the morning and at bedtime.    . Multiple Vitamins-Minerals (ZINC PO) Take by mouth.     No current facility-administered medications for this visit.     Allergies:   Patient has no known allergies.   Social History:  The patient  reports that he has never smoked. His smokeless tobacco use includes snuff. He reports current alcohol use. He reports that he does not use drugs.   Family History:   family history includes  Hypertension in his father.    Review of Systems: Review of Systems  Constitutional: Negative.   Respiratory: Negative.   Cardiovascular: Negative.   Gastrointestinal: Negative.   Musculoskeletal: Negative.   Neurological: Negative.   Psychiatric/Behavioral: Negative.   All other systems reviewed and are negative.    PHYSICAL EXAM: VS:  BP 134/84 (BP Location: Left Arm, Patient Position: Sitting, Cuff Size: Large)   Pulse 79   Ht 6\' 1"  (1.854 m)   Wt 260 lb (117.9 kg)   SpO2 97%   BMI 34.30 kg/m  , BMI Body mass index is 34.3 kg/m. Constitutional:  oriented to person, place, and time. No distress.  HENT:  Head: Grossly  normal Eyes:  no discharge. No scleral icterus.  Neck: No JVD, no carotid bruits  Cardiovascular: Regular rate and rhythm, no murmurs appreciated Pulmonary/Chest: Clear to auscultation bilaterally, no wheezes or rails Abdominal: Soft.  no distension.  no tenderness.  Musculoskeletal: Normal range of motion Neurological:  normal muscle tone. Coordination normal. No atrophy Skin: Skin warm and dry Psychiatric: normal affect, pleasant   Recent Labs: No results found for requested labs within last 8760 hours.    Lipid Panel Lab Results  Component Value Date   CHOL 222 (H) 02/01/2010   HDL 48 02/01/2010   LDLCALC 152 (H) 02/01/2010   TRIG 111 02/01/2010      Wt Readings from Last 3 Encounters:  03/06/21 260 lb (117.9 kg)  03/07/20 260 lb 8 oz (118.2 kg)  02/06/19 274 lb (124.3 kg)       ASSESSMENT AND PLAN:  Nonischemic cardiomyopathy (HCC) Alcohol cessation recommended Concern for alcohol cardiomyopathy No recent evaluation of ejection fraction Previous ejection fraction 35-40% Up to 45-50% on cardiac catheterization 2014 Discussed moderating his fluid intake, salt intake, working on his weight Suggested he call 2015 if Lasix is needed for leg swelling, abdominal distention  Mixed hyperlipidemia We have requested lab work from primary care  Essential hypertension Blood pressure is well controlled on today's visit. No changes made to the medications.  Uncomplicated alcohol dependence (HCC) Previously reported beer daily Recommend he cut way back  OSA on CPAP Stable recommended weight loss   Total encounter time more than 25 minutes  Greater than 50% was spent in counseling and coordination of care with the patient    Orders Placed This Encounter  Procedures  . EKG 12-Lead     Signed, [name], M.D., Ph.D. 03/06/2021  [facility] [place], [place]

## 2021-03-06 ENCOUNTER — Encounter: Payer: Self-pay | Admitting: Cardiovascular Disease

## 2021-03-06 ENCOUNTER — Ambulatory Visit (INDEPENDENT_AMBULATORY_CARE_PROVIDER_SITE_OTHER): Payer: Medicare Other | Admitting: Cardiovascular Disease

## 2021-03-06 ENCOUNTER — Other Ambulatory Visit: Payer: Self-pay

## 2021-03-06 VITALS — BP 134/84 | HR 79 | Ht 73.0 in | Wt 260.0 lb

## 2021-03-06 DIAGNOSIS — I428 Other cardiomyopathies: Secondary | ICD-10-CM | POA: Diagnosis not present

## 2021-03-06 DIAGNOSIS — I10 Essential (primary) hypertension: Secondary | ICD-10-CM | POA: Diagnosis not present

## 2021-03-06 DIAGNOSIS — E782 Mixed hyperlipidemia: Secondary | ICD-10-CM | POA: Diagnosis not present

## 2021-03-06 NOTE — Patient Instructions (Addendum)
We will request labs from PMD  Please call if you would like a CT coronary calcium score  Medication Instructions:  No changes  If you need a refill on your cardiac medications before your next appointment, please call your pharmacy.    Lab work: No new labs needed   If you have labs (blood work) drawn today and your tests are completely normal, you will receive your results only by: Marland Kitchen MyChart Message (if you have MyChart) OR . A paper copy in the mail If you have any lab test that is abnormal or we need to change your treatment, we will call you to review the results.   Testing/Procedures: No new testing needed   Follow-Up: At [facility] you and your health needs are our priority.  As part of our continuing mission to provide you with exceptional heart care, we have created designated Provider Care Teams.  These Care Teams include your primary Cardiologist (physician) and Advanced Practice Providers (APPs -  Physician Assistants and Nurse Practitioners) who all work together to provide you with the care you need, when you need it.  . You will need a follow up appointment in 12 months  . Providers on your designated Care Team:   . Nicolasa Ducking, NP . Eula Listen, PA-C . Marisue Ivan, PA-C  Any Other Special Instructions Will Be Listed Below (If Applicable).  COVID-19 Vaccine Information can be found at: [email or web address] For questions related to vaccine distribution or appointments, please email [email or web address] or call [phone].

## 2021-04-03 ENCOUNTER — Ambulatory Visit: Payer: BC Managed Care – PPO | Admitting: Cardiovascular Disease

## 2021-04-09 ENCOUNTER — Other Ambulatory Visit: Payer: Self-pay | Admitting: Family

## 2021-04-09 DIAGNOSIS — I10 Essential (primary) hypertension: Secondary | ICD-10-CM

## 2021-04-09 DIAGNOSIS — I428 Other cardiomyopathies: Secondary | ICD-10-CM

## 2021-04-21 ENCOUNTER — Other Ambulatory Visit: Payer: Self-pay | Admitting: Family

## 2021-04-21 DIAGNOSIS — I10 Essential (primary) hypertension: Secondary | ICD-10-CM

## 2021-07-31 ENCOUNTER — Telehealth: Payer: Self-pay | Admitting: Cardiovascular Disease

## 2021-07-31 NOTE — Telephone Encounter (Signed)
   Name: Casey Hess.  DOB: 02/20/1956  MRN: [id number]   Primary Cardiologist: Julien Nordmann, MD  Chart reviewed as part of pre-operative protocol coverage. Patient was contacted 07/31/2021 in reference to pre-operative risk assessment for pending surgery as outlined below.  Casey Hess. was last seen on 03/06/21 by Dr. Mariah Milling.  Since that day, Casey Hess. has done fine from a cardiac standpoint. Unfortunately he has been on crutches for the past several months after developing a stress fracture in his knee. Despite this he is still able to get around and recently used his crut[place]. He can complete 4 METs without anginal complaints  Therefore, based on ACC/AHA guidelines, the patient would be at acceptable risk for the planned procedure without further cardiovascular testing.   The patient was advised that if he develops new symptoms prior to surgery to contact our office to arrange for a follow-up visit, and he verbalized understanding.  Patient informs me he is no longer taking aspirin 81mg  daily due to increased bleeding.  I will route this recommendation to the requesting party via Epic fax function and remove from pre-op pool. Please call with questions.  , PA-C 07/31/2021, 12:05 PM

## 2021-07-31 NOTE — Telephone Encounter (Signed)
   [facility] Pre-operative Risk Assessment    Patient Name: Casey Hess.  DOB: 02/07/1956 MRN: [id number]  HEARTCARE STAFF:  - IMPORTANT!!!!!! Under Visit Info/Reason for Call, type in Other and utilize the format Clearance MM/DD/YY or Clearance TBD. Do not use dashes or single digits. - Please review there is not already an duplicate clearance open for this procedure. - If request is for dental extraction, please clarify the # of teeth to be extracted. - If the patient is currently at the dentist's office, call Pre-Op Callback Staff (MA/nurse) to input urgent request.  - If the patient is not currently in the dentist office, please route to the Pre-Op pool.  Request for surgical clearance:  What type of surgery is being performed? Left total knee arthroplasty   When is this surgery scheduled? 08/30/21  What type of clearance is required (medical clearance vs. Pharmacy clearance to hold med vs. Both)? Both   Are there any medications that need to be held prior to surgery and how long? Not listed, please advise if needed   Practice name and name of physician performing surgery? [facility] - Dr Pilar Plate Aluisio   What is the office phone number? [phone]   7.   What is the office fax number? [phone]  8.   Anesthesia type (None, local, MAC, general) ? Choice    Ace Gins 07/31/2021, 11:02 AM  _________________________________________________________________   (provider comments below)

## 2022-01-29 ENCOUNTER — Other Ambulatory Visit: Payer: Self-pay | Admitting: Cardiovascular Disease

## 2022-01-29 DIAGNOSIS — I10 Essential (primary) hypertension: Secondary | ICD-10-CM

## 2022-01-29 NOTE — Telephone Encounter (Signed)
Please schedule 12 month F/U appointment for 90 day refills. Thank you! 

## 2022-01-30 ENCOUNTER — Telehealth: Payer: Self-pay | Admitting: Cardiovascular Disease

## 2022-01-30 NOTE — Telephone Encounter (Signed)
°*  STAT* If patient is at the pharmacy, call can be transferred to refill team.   1. Which medications need to be refilled? (please list name of each medication and dose if known) losartan 100mg  1 tablet daily, Amlodipine 5 mg 1 tablet daily   2. Which pharmacy/location (including street and city if local pharmacy) is medication to be sent to? [facility] [place]  3. Do they need a 30 day or 90 day supply? 90 day

## 2022-01-30 NOTE — Telephone Encounter (Signed)
Refills sent to pharmacy as requested.

## 2022-02-22 ENCOUNTER — Encounter: Payer: Self-pay | Admitting: Cardiovascular Disease

## 2022-02-28 NOTE — Progress Notes (Signed)
Office Note ? ?Date:  03/02/2022  ? ?ID:  Casey Ringer., DOB 14-Apr-1956, MRN [id number] ? ?PCP:  Noni Saupe, MD  ? ?Chief Complaint  ?Patient presents with  ? 12 month follow up   ?  "Doing well." Medications reviewed by the patient verbally.   ? ? ?HPI:  ?Casey Ghuman. is a 66 y.o. male with PMHx of ?HTN,  ?HLD,  ?LBBB ?Smoker, not cig ?significant alcohol history drinking at least 6 beers per day ,  ?erectile dysfunction,  ?OSA who wears CPAP , ?obesity  ?cardiac catheterization with no significant disease in 2014 ?Echocardiogram 2014 ejection fraction 35-40% ?Cardiac catheterization 2014 ejection fraction 45-50% ?No study since that time ?who presents today for follow-up of his hypertension. ? 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 ?Continues to work, Aeronautical engineer, more supervisory ? ?Lab work reviewed ?Lab work done through primary care ?Total chol 160, ?Ldl 99 ? ?History of Statin myalgias, lipitor ? ?Previous office visits reported drinking beer on a regular basis ? ?Non-smoker no diabetes,  ? ?EKG personally reviewed by myself on todays visit ?Normal sinus rhythm rate 91 bpm left bundle branch block, PVCs ? ?Other past medical history  ?Previous stress test in 2012 which revealed no evidenced of ischemia, but reduced EF.  ?A follow-up 2D echo indicated EF 50-55% with mildly dilated LV and mild LVH, otherwise normal study.  ? ? ?PMH:   has a past medical history of Erectile dysfunction, Hyperlipidemia, Hypertension, Injury by electrocution, Injury of shoulder, LBBB (left bundle branch block), Nonischemic cardiomyopathy (HCC), Obesity, and Obstructive sleep apnea. ? ?PSH:    ?Past Surgical History:  ?Procedure Laterality Date  ? APPENDECTOMY    ? CARDIAC CATHETERIZATION  06/25/2013  ?  [facility]; minor luminal irregularities, EF 45-50%;  ? KNEE SURGERY    ? KNEE SURGERY    ? meniscus  ? LUMBAR DISC SURGERY    ? SHOULDER SURGERY    ? UMBILICAL HERNIA REPAIR    ? WRIST SURGERY    ? right  ? ? ?Current Outpatient Medications  ?Medication Sig Dispense Refill  ? aspirin 81 MG chewable tablet Chew 81 mg by mouth daily.    ? gabapentin (NEURONTIN) 300 MG capsule Take 300 mg by mouth 3 (three) times daily.    ? methocarbamol (ROBAXIN) 500 MG tablet Take 500 mg by mouth in the morning and at bedtime.    ? Multiple Vitamins-Minerals (ZINC PO) Take by mouth.    ? amLODipine (NORVASC) 5 MG tablet TAKE 1 TABLET(5 MG) BY MOUTH DAILY 90 tablet 3  ? carvedilol (COREG) 6.25 MG tablet TAKE 1 TABLET(6.25 MG) BY MOUTH TWICE DAILY WITH A MEAL 180 tablet 3  ? doxazosin (CARDURA) 8 MG tablet TAKE 1 TABLET(8 MG) BY MOUTH DAILY 90 tablet 3  ? losartan (COZAAR) 100 MG tablet TAKE 1 TABLET(100 MG) BY MOUTH DAILY 90 tablet 3  ? ?No current facility-administered medications for this visit.  ? ? ? ?Allergies:   Patient has no known allergies.  ? ?Social History:  The patient  reports that he has never smoked. His smokeless tobacco use includes snuff. He reports current alcohol use. He reports that he does not use drugs.  ? ?Family History:   family history includes  Hypertension in his father.  ? ? ?Review of Systems: ?Review of Systems  ?Constitutional: Negative.   ?Respiratory: Negative.    ?Cardiovascular: Negative.   ?Gastrointestinal: Negative.   ?Musculoskeletal:  Positive for back pain.  ?Neurological: Negative.   ?Psychiatric/Behavioral: Negative.    ?All other systems reviewed and are negative. ? ? ?PHYSICAL EXAM: ?VS:  BP 140/70 (BP Location: Left Arm, Patient Position: Sitting, Cuff Size: Normal)   Pulse 89   Ht 6\' 1"  (1.854 m)   Wt 259 lb (117.5 kg)   SpO2 98%   BMI 34.17 kg/m?  , BMI Body mass index is 34.17 kg/m? ?Constitutional:  oriented to person, place, and time. No distress.  ?HENT:  ?Head: Grossly  normal ?Eyes:  no discharge. No scleral icterus.  ?Neck: No JVD, no carotid bruits  ?Cardiovascular: Regular rate and rhythm, no murmurs appreciated ?Pulmonary/Chest: Clear to auscultation bilaterally, no wheezes or rails ?Abdominal: Soft.  no distension.  no tenderness.  ?Musculoskeletal: Normal range of motion ?Neurological:  normal muscle tone. Coordination normal. No atrophy ?Skin: Skin warm and dry ?Psychiatric: normal affect, pleasant ? ? ?Recent Labs: ?No results found for requested labs within last 8760 hours.  ? ? ?Lipid Panel ?Lab Results  ?Component Value Date  ? CHOL 222 (H) 02/01/2010  ? HDL 48 02/01/2010  ? LDLCALC 152 (H) 02/01/2010  ? TRIG 111 02/01/2010  ? ?  ? ?Wt Readings from Last 3 Encounters:  ?03/02/22 259 lb (117.5 kg)  ?03/06/21 260 lb (117.9 kg)  ?03/07/20 260 lb 8 oz (118.2 kg)  ?  ? ? ? ?ASSESSMENT AND PLAN: ? ?Nonischemic cardiomyopathy (HCC) ?Alcohol cessation recommended, previous concern for alcohol neuropathy ?Previous ejection fraction 35-40% ?Up to 45-50% on cardiac catheterization 2014 ?Stressed importance of aggressive blood pressure control ?Weight remaining high, dietary changes recommended ? ?Mixed hyperlipidemia ?Reasonable cholesterol in 2022 ? ?Essential hypertension ?Some fatigue during the day, recommend the move 1 or 2 of his medications to the p.m. ? ?Uncomplicated alcohol dependence (HCC) ?Previously reported beer daily ?Recommend cessation ? ?OSA on CPAP ?Weight remains high ? ? Total encounter time more than 30 minutes ? Greater than 50% was spent in counseling and coordination of care with the patient ? ? ? ?Orders Placed This Encounter  ?Procedures  ? EKG 12-Lead  ? ? ? ?Signed, ?2023, M.D., Ph.D. ?03/02/2022  ?[facility], [place] ?[phone] ? ?

## 2022-03-02 ENCOUNTER — Ambulatory Visit (INDEPENDENT_AMBULATORY_CARE_PROVIDER_SITE_OTHER): Payer: Medicare Other | Admitting: Cardiovascular Disease

## 2022-03-02 ENCOUNTER — Encounter: Payer: Self-pay | Admitting: Cardiovascular Disease

## 2022-03-02 ENCOUNTER — Other Ambulatory Visit: Payer: Self-pay

## 2022-03-02 VITALS — BP 140/70 | HR 89 | Ht 73.0 in | Wt 259.0 lb

## 2022-03-02 DIAGNOSIS — I10 Essential (primary) hypertension: Secondary | ICD-10-CM | POA: Diagnosis not present

## 2022-03-02 DIAGNOSIS — N529 Male erectile dysfunction, unspecified: Secondary | ICD-10-CM

## 2022-03-02 DIAGNOSIS — I428 Other cardiomyopathies: Secondary | ICD-10-CM | POA: Diagnosis not present

## 2022-03-02 DIAGNOSIS — E782 Mixed hyperlipidemia: Secondary | ICD-10-CM

## 2022-03-02 MED ORDER — DOXAZOSIN MESYLATE 8 MG PO TABS: ORAL_TABLET | ORAL | 3 refills | Status: DC

## 2022-03-02 MED ORDER — CARVEDILOL 6.25 MG PO TABS: ORAL_TABLET | ORAL | 3 refills | Status: DC

## 2022-03-02 MED ORDER — LOSARTAN POTASSIUM 100 MG PO TABS: ORAL_TABLET | ORAL | 3 refills | Status: DC

## 2022-03-02 MED ORDER — AMLODIPINE BESYLATE 5 MG PO TABS: ORAL_TABLET | ORAL | 3 refills | Status: DC

## 2022-03-02 NOTE — Patient Instructions (Signed)
Medication Instructions:  No changes  If you need a refill on your cardiac medications before your next appointment, please call your pharmacy.   Lab work: No new labs needed  Testing/Procedures: No new testing needed  Follow-Up: At [facility], you and your health needs are our priority.  As part of our continuing mission to provide you with exceptional heart care, we have created designated Provider Care Teams.  These Care Teams include your primary Cardiologist (physician) and Advanced Practice Providers (APPs -  Physician Assistants and Nurse Practitioners) who all work together to provide you with the care you need, when you need it.  You will need a follow up appointment in 12 months  Providers on your designated Care Team:   Christopher Berge, NP Ryan Dunn, PA-C Cadence Furth, PA-C  COVID-19 Vaccine Information can be found at: [email or web address] For questions related to vaccine distribution or appointments, please email [email or web address] or call [phone].   

## 2022-04-24 ENCOUNTER — Telehealth: Payer: Self-pay | Admitting: *Deleted

## 2022-04-24 NOTE — Telephone Encounter (Signed)
Called and spoke with patient regarding his CPAP machine.  He states that he believes that his CPAP machine has an SD card in it and he will bring it to his appointment at 9:30 am tomorrow.  Advised to arrive by 9:15 am for check in and to fill out paperwork.  Provided address as well.  He verbalized understanding and stated he would see [name] tomorrow.  Nothing further needed. ?

## 2022-04-25 ENCOUNTER — Ambulatory Visit (INDEPENDENT_AMBULATORY_CARE_PROVIDER_SITE_OTHER): Payer: Medicare Other | Admitting: Adult Health

## 2022-04-25 ENCOUNTER — Encounter: Payer: Self-pay | Admitting: Adult Health

## 2022-04-25 DIAGNOSIS — Z9989 Dependence on other enabling machines and devices: Secondary | ICD-10-CM | POA: Diagnosis not present

## 2022-04-25 DIAGNOSIS — R0982 Postnasal drip: Secondary | ICD-10-CM | POA: Diagnosis not present

## 2022-04-25 DIAGNOSIS — G4733 Obstructive sleep apnea (adult) (pediatric): Secondary | ICD-10-CM | POA: Diagnosis not present

## 2022-04-25 DIAGNOSIS — R400 Somnolence: Secondary | ICD-10-CM

## 2022-04-25 NOTE — Assessment & Plan Note (Addendum)
Patient reports excellent compliance with CPAP.  CPAP download has been requested.  We have contacted patient's homecare company for compliance report.  Patient education was given on sleep apnea and CPAP. ?Patient will continue with current settings until we get a download.  Patient CPAP is only a few months old. ? ?- discussed how weight can impact sleep and risk for sleep disordered breathing ?- discussed options to assist with weight loss: combination of diet modification, cardiovascular and strength training exercises ?  ?- had an extensive discussion regarding the adverse health consequences related to untreated sleep disordered breathing ?- specifically discussed the risks for hypertension, coronary artery disease, cardiac dysrhythmias, cerebrovascular disease, and diabetes ?- lifestyle modification discussed ?  ?- discussed how sleep disruption can increase risk of accidents, particularly when driving ?- safe driving practices were discussed ?  ? ? ?Plan  ?Patient Instructions  ?CPAP download  ?Continue on CPAP At bedtime   ?Wear all night long .  ?Do not drive if sleepy  ?Work on healthy weight loss.  ?Saline nasal rinses Twice daily   ?Saline nasal gel At bedtime   ?Claritin 10mg  daily As needed   ?Use caution with sedating medications.  ?Follow up in 3 months and As needed   ? ? ?  ? ?

## 2022-04-25 NOTE — Progress Notes (Addendum)
? ?@Patient  ID: ., male    DOB: 1956/11/07, 66 y.o.   MRN: [id number] ? ?Chief Complaint  ?Patient presents with  ? Consult  ? ? ?Referring provider: ?[id number], MD ? ?HPI: ?66 year old male never smoker seen for sleep consult Apr 25, 2022 to establish for sleep apnea ?Medical history significant for nonischemic cardiomyopathy and congestive heart failure ? ?TEST/EVENTS :  ? ?04/25/2022 Sleep consult  ?Patient presents for a consult today.  Kindly referred by Dr. 06/25/2022 . Patient is here to establish for sleep apnea.  Patient has had sleep apnea and has been on CPAP for greater than 20 years . Can not sleep without it. Managed by [facility]. Wears CPAP 8 hr each night  Unsure what his setting are . Uses a dream wear nasal mask. Got new CPAP machine 3 months ago .  ?Typically goes to bed about 8 to 10 PM.  Takes only a few minutes to go to sleep.  Uses melatonin 20 mg daily at nighttime.  Gets up about 3 times each night.  Is up at 6 AM.  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  Does do some heavy machinery at times.  Weight is up about 20 pounds over the last 2 years.  Current weight 259 pounds with a BMI at 34.  Patient has 1 cup of coffee and 1 soda most days.  Previous split-night sleep study July 28, 2013 showed severe sleep apnea with AHI at 92/hour with optimal CPAP pressure at 14 cm H2O. [place] is 11 out of 24, gets sleepy watching tv and in the afternoon time.  ?No symptoms suspicious for cataplexy or sleep paralysis.  Patient says he does get sleepy in the afternoon hours.  He says he wears a CPAP no matter what but still has some daytime sleepiness.  Patient does drink on average 2 beers a day.  Takes Neurontin 300 mg 3 times a day.  Patient has chronic back pain.  Says Neurontin is the only thing that helps it.  We discussed sedating side effects of medications and alcohol. ?Patient does complain of nasal stuffiness and drainage in his throat.   Feels that he always has some mucus in the back of his throat.  No fever or discolored mucus ? ?Social history patient is married.  Has adult children.  Has his own landscaping business.  Lives at home with his wife.  Is active.  Does smokeless tobacco with daily dip.  Drinks 2 beers a day.  Sometimes more on the weekends.  Does not marijuana about weekly.  No other drug use history.  Has never smoked cigarettes. ? ?Family history positive for throat cancer and COPD. ? ?Surgical history positive for bilateral knee surgery, umbilical hernia repair, right rotator cuff repair and appendectomy. ? ? ? ?No Known Allergies ? ?Immunization History  ?Administered Date(s) Administered  ? Influenza-Unspecified 09/16/2018  ? PFIZER(Purple Top)SARS-COV-2 Vaccination 01/29/2020, 02/16/2020  ? ? ?Past Medical History:  ?Diagnosis Date  ? Erectile dysfunction   ? Hyperlipidemia   ? Hypertension   ? Injury by electrocution   ? Injury of shoulder   ? LBBB (left bundle branch block)   ? Chronic  ? Nonischemic cardiomyopathy (HCC)   ? a. 05/2013: echo w/ EF of 35-40% b. 06/2013: cath with mild luminal irregularities with no significant CAD. Mild inferior wall HK.  ? Obesity   ? Obstructive sleep apnea   ? a. on CPAP  ? ? ?  Tobacco History: ?Social History  ? ?Tobacco Use  ?Smoking Status Never  ?Smokeless Tobacco Current  ? Types: Snuff  ? ?Ready to quit: Not Answered ?Counseling given: Not Answered ? ? ?Outpatient Medications Prior to Visit  ?Medication Sig Dispense Refill  ? amLODipine (NORVASC) 5 MG tablet TAKE 1 TABLET(5 MG) BY MOUTH DAILY 90 tablet 3  ? carvedilol (COREG) 6.25 MG tablet TAKE 1 TABLET(6.25 MG) BY MOUTH TWICE DAILY WITH A MEAL 180 tablet 3  ? doxazosin (CARDURA) 8 MG tablet TAKE 1 TABLET(8 MG) BY MOUTH DAILY 90 tablet 3  ? gabapentin (NEURONTIN) 300 MG capsule Take 300 mg by mouth 3 (three) times daily.    ? losartan (COZAAR) 100 MG tablet TAKE 1 TABLET(100 MG) BY MOUTH DAILY 90 tablet 3  ? methocarbamol (ROBAXIN)  500 MG tablet Take 500 mg by mouth in the morning and at bedtime.    ? Multiple Vitamins-Minerals (ZINC PO) Take by mouth.    ? aspirin 81 MG chewable tablet Chew 81 mg by mouth daily. (Patient not taking: Reported on 04/25/2022)    ? ?No facility-administered medications prior to visit.  ? ? ? ?Review of Systems:  ? ?Constitutional:   No  weight loss, night sweats,  Fevers, chills, fatigue, or  lassitude. ? ?HEENT:   No headaches,  Difficulty swallowing,  Tooth/dental problems, or  Sore throat,  ?              No sneezing, itching, ear ache,  ?+nasal congestion, post nasal drip,  ? ?CV:  No chest pain,  Orthopnea, PND, swelling in lower extremities, anasarca, dizziness, palpitations, syncope.  ? ?GI  No heartburn, indigestion, abdominal pain, nausea, vomiting, diarrhea, change in bowel habits, loss of appetite, bloody stools.  ? ?Resp: No shortness of breath with exertion or at rest.  No excess mucus, no productive cough,  No non-productive cough,  No coughing up of blood.  No change in color of mucus.  No wheezing.  No chest wall deformity ? ?Skin: no rash or lesions. ? ?GU: no dysuria, change in color of urine, no urgency or frequency.  No flank pain, no hematuria  ? ?MS:  No joint pain or swelling.  No decreased range of motion.  No back pain. ? ? ? ?Physical Exam ? ?BP 136/70 (BP Location: Left Arm, Cuff Size: Large)   Pulse (!) 102   Temp 97.8 ?F (36.6 ?C) (Temporal)   Ht 6\' 1"  (1.854 m)   Wt 259 lb 6.4 oz (117.7 kg)   SpO2 96%   BMI 34.22 kg/m?  ? ?GEN: A/Ox3; pleasant , NAD, well nourished  ?  ?HEENT:  [place]/AT,  NOSE-clear, THROAT-clear, no lesions, no postnasal drip or exudate noted.  ?Class II-III MP airway ? ?NECK:  Supple w/ fair ROM; no JVD; normal carotid impulses w/o bruits; no thyromegaly or nodules palpated; no lymphadenopathy.   ? ?RESP  Clear  P & A; w/o, wheezes/ rales/ or rhonchi. no accessory muscle use, no dullness to percussion ? ?CARD:  RRR, no m/r/g, no peripheral edema, pulses intact,  no cyanosis or clubbing. ? ?GI:   Soft & nt; nml bowel sounds; no organomegaly or masses detected.  ? ?Musco: Warm bil, no deformities or joint swelling noted.  ? ?Neuro: alert, no focal deficits noted.   ? ?Skin: Warm, no lesions or rashes ? ? ? ?Lab Results: ? ?CBC ? ? ? ?BNP ?No results found for: BNP ? ?ProBNP ?No results found for: PROBNP ? ?Imaging: ?  No results found. ? ? ? ?No results found for: NITRICOXIDE ? ? ? ? ? ?Assessment & Plan:  ? ?OSA on CPAP ?Patient reports excellent compliance with CPAP.  CPAP download has been requested.  We have contacted patient's homecare company for compliance report.  Patient education was given on sleep apnea and CPAP. ?Patient will continue with current settings until we get a download.  Patient CPAP is only a few months old. ? ?Plan  ?Patient Instructions  ?CPAP download  ?Continue on CPAP At bedtime   ?Wear all night long .  ?Do not drive if sleepy  ?Work on healthy weight loss.  ?Saline nasal rinses Twice daily   ?Saline nasal gel At bedtime   ?Claritin 10mg  daily As needed   ?Use caution with sedating medications.  ?Follow up in 3 months and As needed   ? ? ?  ? ? ?Post-nasal drainage ?Chronic rhinitis/postnasal drainage-would add in saline nasal spray and Claritin.  If symptoms or not improving will need further work-up on follow-up visit. ? ?Plan  ?Patient Instructions  ?CPAP download  ?Continue on CPAP At bedtime   ?Wear all night long .  ?Do not drive if sleepy  ?Work on healthy weight loss.  ?Saline nasal rinses Twice daily   ?Saline nasal gel At bedtime   ?Claritin 10mg  daily As needed   ?Use caution with sedating medications.  ?Follow up in 3 months and As needed   ? ? ?  ? ? ?Daytime sleepiness ?Patient has residual daytime sleepiness despite reported excellent CPAP usage.  We will get CPAP download.  Also suspect this is multifactorial as he is on gabapentin 300 mg 3 times daily and does daily alcohol use.  This may be producing some residual daytime  sleepiness and side effects of sedation with medications. ?Patient education was given.  Option would be to see if he could change his gabapentin dosing so that he does not take a middle of the day dose not sure if [facility]

## 2022-04-25 NOTE — Assessment & Plan Note (Signed)
Chronic rhinitis/postnasal drainage-would add in saline nasal spray and Claritin.  If symptoms or not improving will need further work-up on follow-up visit. ? ?Plan  ?Patient Instructions  ?CPAP download  ?Continue on CPAP At bedtime   ?Wear all night long .  ?Do not drive if sleepy  ?Work on healthy weight loss.  ?Saline nasal rinses Twice daily   ?Saline nasal gel At bedtime   ?Claritin 10mg  daily As needed   ?Use caution with sedating medications.  ?Follow up in 3 months and As needed   ? ? ?  ? ?

## 2022-04-25 NOTE — Patient Instructions (Addendum)
CPAP download  ?Continue on CPAP At bedtime   ?Wear all night long .  ?Do not drive if sleepy  ?Work on healthy weight loss.  ?Saline nasal rinses Twice daily   ?Saline nasal gel At bedtime   ?Claritin 10mg  daily As needed   ?Use caution with sedating medications.  ?Follow up in 3 months and As needed   ? ? ?

## 2022-04-25 NOTE — Assessment & Plan Note (Signed)
Healthy weight loss discussed 

## 2022-04-25 NOTE — Assessment & Plan Note (Signed)
Patient has residual daytime sleepiness despite reported excellent CPAP usage.  We will get CPAP download.  Also suspect this is multifactorial as he is on gabapentin 300 mg 3 times daily and does daily alcohol use.  This may be producing some residual daytime sleepiness and side effects of sedation with medications. ?Patient education was given.  Option would be to see if he could change his gabapentin dosing so that he does not take a middle of the day dose not sure if this is an option or not. ?We will check CPAP download and then further evaluate on return visit ?Advised on alcohol usage with medications. ? ?Plan  ?PLAN:  ?Patient Instructions  ?CPAP download  ?Continue on CPAP At bedtime   ?Wear all night long .  ?Do not drive if sleepy  ?Work on healthy weight loss.  ?Saline nasal rinses Twice daily   ?Saline nasal gel At bedtime   ?Claritin 10mg  daily As needed   ?Use caution with sedating medications.  ?Follow up in 3 months and As needed   ? ? ?  ? ?

## 2022-07-26 ENCOUNTER — Ambulatory Visit: Payer: Medicare Other | Admitting: Adult Health

## 2022-08-09 ENCOUNTER — Ambulatory Visit (INDEPENDENT_AMBULATORY_CARE_PROVIDER_SITE_OTHER): Payer: Medicare Other | Admitting: Adult Health

## 2022-08-09 ENCOUNTER — Encounter: Payer: Self-pay | Admitting: Adult Health

## 2022-08-09 VITALS — BP 138/82 | HR 70 | Ht 73.0 in | Wt 273.8 lb

## 2022-08-09 DIAGNOSIS — G4733 Obstructive sleep apnea (adult) (pediatric): Secondary | ICD-10-CM

## 2022-08-09 DIAGNOSIS — Z9989 Dependence on other enabling machines and devices: Secondary | ICD-10-CM

## 2022-08-09 NOTE — Patient Instructions (Signed)
Continue on CPAP At bedtime   Wear all night long .  Do not drive if sleepy  Work on healthy weight loss.  Saline nasal rinses Twice daily   Saline nasal gel At bedtime   Claritin 10mg  daily As needed   Use caution with sedating medications.  Follow up in 1 year and As needed

## 2022-08-09 NOTE — Progress Notes (Signed)
@Patient  ID: ., male    DOB: 1956/03/25, 66 y.o.   MRN: [id number]  Chief Complaint  Patient presents with   Follow-up    Referring provider: [id number], MD  HPI: 66 year old male never smoker seen for sleep consult Apr 25, 2022 to establish for sleep apnea Medical history significant for nonischemic cardiomyopathy and congestive heart failure  TEST/EVENTS :   Previous split-night sleep study July 28, 2013 showed severe sleep apnea with AHI at 92/hour with optimal CPAP pressure at 14 cm H2O.   08/09/2022 Follow up ; OSA  Patient returns for 58-month follow-up.  Patient has underlying severe sleep apnea.  Patient remains on CPAP at bedtime.  Patient says he is doing very well on CPAP.  Cannot sleep without it.  Patient says he does need some refill of supplies and new mask.  Patient feels rested and feels that he benefits from CPAP with decreased daytime sleepiness   CPAP download shows excellent compliance with 100% usage.  Patient is on auto CPAP 5 to 20 cm H2O.  Daily average usage at 9.5 hours.  Patient's AHI is 1.4/hour.  Average daily pressure at 12.9 cm H2O.   No Known Allergies  Immunization History  Administered Date(s) Administered   Influenza-Unspecified 09/16/2018   PFIZER(Purple Top)SARS-COV-2 Vaccination 01/29/2020, 02/16/2020    Past Medical History:  Diagnosis Date   Erectile dysfunction    Hyperlipidemia    Hypertension    Injury by electrocution    Injury of shoulder    LBBB (left bundle branch block)    Chronic   Nonischemic cardiomyopathy (HCC)    a. 05/2013: echo w/ EF of 35-40% b. 06/2013: cath with mild luminal irregularities with no significant CAD. Mild inferior wall HK.   Obesity    Obstructive sleep apnea    a. on CPAP    Tobacco History: Social History   Tobacco Use  Smoking Status Never  Smokeless Tobacco Current   Types: Snuff   Ready to quit: Not Answered Counseling given: Not Answered   Outpatient  Medications Prior to Visit  Medication Sig Dispense Refill   amLODipine (NORVASC) 5 MG tablet TAKE 1 TABLET(5 MG) BY MOUTH DAILY 90 tablet 3   carvedilol (COREG) 6.25 MG tablet TAKE 1 TABLET(6.25 MG) BY MOUTH TWICE DAILY WITH A MEAL 180 tablet 3   cyanocobalamin 100 MCG tablet Take 100 mcg by mouth daily.     doxazosin (CARDURA) 8 MG tablet TAKE 1 TABLET(8 MG) BY MOUTH DAILY 90 tablet 3   losartan (COZAAR) 100 MG tablet TAKE 1 TABLET(100 MG) BY MOUTH DAILY 90 tablet 3   rosuvastatin (CRESTOR) 5 MG tablet Take 5 mg by mouth daily.     aspirin 81 MG chewable tablet Chew 81 mg by mouth daily. (Patient not taking: Reported on 04/25/2022)     gabapentin (NEURONTIN) 300 MG capsule Take 300 mg by mouth 3 (three) times daily. (Patient not taking: Reported on 08/09/2022)     methocarbamol (ROBAXIN) 500 MG tablet Take 500 mg by mouth in the morning and at bedtime. (Patient not taking: Reported on 08/09/2022)     Multiple Vitamins-Minerals (ZINC PO) Take by mouth. (Patient not taking: Reported on 08/09/2022)     No facility-administered medications prior to visit.     Review of Systems:   Constitutional:   No  weight loss, night sweats,  Fevers, chills, fatigue, or  lassitude.  HEENT:   No headaches,  Difficulty swallowing,  Tooth/dental problems,  or  Sore throat,                No sneezing, itching, ear ache, nasal congestion, post nasal drip,   CV:  No chest pain,  Orthopnea, PND, swelling in lower extremities, anasarca, dizziness, palpitations, syncope.   GI  No heartburn, indigestion, abdominal pain, nausea, vomiting, diarrhea, change in bowel habits, loss of appetite, bloody stools.   Resp: No shortness of breath with exertion or at rest.  No excess mucus, no productive cough,  No non-productive cough,  No coughing up of blood.  No change in color of mucus.  No wheezing.  No chest wall deformity  Skin: no rash or lesions.  GU: no dysuria, change in color of urine, no urgency or frequency.  No  flank pain, no hematuria   MS:  No joint pain or swelling.  No decreased range of motion.  No back pain.    Physical Exam  BP 138/82 (BP Location: Left Arm, Cuff Size: Normal)   Pulse 70   Ht 6\' 1"  (1.854 m)   Wt 273 lb 12.8 oz (124.2 kg)   SpO2 97%   BMI 36.12 kg/m   GEN: A/Ox3; pleasant , NAD, well nourished    HEENT:  [place]/AT,   NOSE-clear, THROAT-clear, no lesions, no postnasal drip or exudate noted.   NECK:  Supple w/ fair ROM; no JVD; normal carotid impulses w/o bruits; no thyromegaly or nodules palpated; no lymphadenopathy.    RESP  Clear  P & A; w/o, wheezes/ rales/ or rhonchi. no accessory muscle use, no dullness to percussion  CARD:  RRR, no m/r/g, no peripheral edema, pulses intact, no cyanosis or clubbing.  GI:   Soft & nt; nml bowel sounds; no organomegaly or masses detected.   Musco: Warm bil, no deformities or joint swelling noted.   Neuro: alert, no focal deficits noted.    Skin: Warm, no lesions or rashes    Lab Results:  CBC   BNP No results found for: "BNP"  ProBNP No results found for: "PROBNP"  Imaging: No results found.        No data to display          No results found for: "NITRICOXIDE"      Assessment & Plan:   No problem-specific Assessment & Plan notes found for this encounter.     , NP 08/09/2022

## 2022-08-10 NOTE — Assessment & Plan Note (Signed)
Excellent control compliance on CPAP continue on current settings  Plan  Patient Instructions  Continue on CPAP At bedtime   Wear all night long .  Do not drive if sleepy  Work on healthy weight loss.  Saline nasal rinses Twice daily   Saline nasal gel At bedtime   Claritin 10mg  daily As needed   Use caution with sedating medications.  Follow up in 1 year and As needed

## 2022-08-12 NOTE — Progress Notes (Signed)
Reviewed and agree with assessment/plan.   Rondal Vandevelde, MD [facility] 08/12/2022, 4:13 PM Pager:  [phone]  

## 2023-02-07 ENCOUNTER — Telehealth: Payer: Self-pay | Admitting: Cardiovascular Disease

## 2023-02-07 ENCOUNTER — Other Ambulatory Visit: Payer: Self-pay | Admitting: Cardiovascular Disease

## 2023-02-07 DIAGNOSIS — I10 Essential (primary) hypertension: Secondary | ICD-10-CM

## 2023-02-07 DIAGNOSIS — I428 Other cardiomyopathies: Secondary | ICD-10-CM

## 2023-02-07 MED ORDER — CARVEDILOL 6.25 MG PO TABS: ORAL_TABLET | ORAL | 0 refills | Status: DC

## 2023-02-07 MED ORDER — AMLODIPINE BESYLATE 5 MG PO TABS: ORAL_TABLET | ORAL | 0 refills | Status: DC

## 2023-02-07 NOTE — Telephone Encounter (Signed)
Good Morning Casey Hess, could you please schedule a 12 month follow up appointment for this patient? The patient was last seen by Dr. Rockey Situ on 03-02-2022. Thank you so much.

## 2023-02-07 NOTE — Telephone Encounter (Signed)
*  STAT* If patient is at the pharmacy, call can be transferred to refill team.   1. Which medications need to be refilled? (please list name of each medication and dose if known)   amLODipine (NORVASC) 5 MG tablet  carvedilol (COREG) 6.25 MG tablet  losartan (COZAAR) 100 MG tablet   2. Which pharmacy/location (including street and city if local pharmacy) is medication to be sent to?  [id number] - [place], [place] - [address] AT [address]   3. Do they need a 30 day or 90 day supply?   90 day  Patient stated he has 4-5 tablets left.  Patient has appointment on 5/7.

## 2023-02-07 NOTE — Telephone Encounter (Signed)
Scheduled on 5/7

## 2023-04-22 NOTE — Progress Notes (Unsigned)
Office Note  Date:  04/23/2023   ID:  Casey Hess., DOB May 26, 1956, MRN [id number]  PCP:  Adrian Prince, MD   Chief Complaint  Patient presents with   12 month follow up     "Doing well." Medications reviewed by the patient verbally.     HPI:  Casey Hess. is a 67 y.o. male with PMHx of HTN,  HLD,  LBBB Smoker, not cig significant alcohol history drinking at least 6 beers per day ,  erectile dysfunction,  OSA who wears CPAP , obesity  cardiac catheterization with no significant disease in 2014 Echocardiogram 2014 ejection fraction 35-40% Cardiac catheterization 2014 ejection fraction 45-50% No study since that time who presents today for follow-up of his hypertension.  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  A follow-up 2D echo indicated EF 50-55% with mildly dilated LV and mild LVH, otherwise normal study.    PMH:   has a past medical history of Erectile dysfunction, Hyperlipidemia, Hypertension, Injury by electrocution, Injury of shoulder, LBBB (left bundle branch block), Nonischemic cardiomyopathy (HCC), Obesity, and Obstructive sleep apnea.  PSH:    Past Surgical History:  Procedure Laterality Date   APPENDECTOMY     CARDIAC  CATHETERIZATION  06/25/2013   [facility]; minor luminal irregularities, EF 45-50%;   KNEE SURGERY     KNEE SURGERY     meniscus   LUMBAR DISC SURGERY     SHOULDER SURGERY     UMBILICAL HERNIA REPAIR     WRIST SURGERY     right    Current Outpatient Medications  Medication Sig Dispense Refill   amLODipine (NORVASC) 5 MG tablet TAKE 1 TABLET(5 MG) BY MOUTH DAILY 90 tablet 0   carvedilol (COREG) 6.25 MG tablet TAKE 1 TABLET(6.25 MG) BY MOUTH TWICE DAILY WITH A MEAL 180 tablet 0   cyanocobalamin 100 MCG tablet Take 100 mcg by mouth daily.     cyclobenzaprine (FLEXERIL) 10 MG tablet Take 10 mg by mouth 3 (three) times daily as needed.     doxazosin (CARDURA) 8 MG tablet TAKE 1 TABLET(8 MG) BY MOUTH DAILY 90 tablet 3   gabapentin (NEURONTIN) 300 MG capsule Take 300 mg by mouth at bedtime.     losartan (COZAAR) 100 MG tablet TAKE 1 TABLET(100 MG) BY MOUTH DAILY 90 tablet 0   methocarbamol (ROBAXIN) 500 MG tablet Take 500 mg by mouth in the morning and at bedtime.     Multiple Vitamins-Minerals (ZINC PO) Take by mouth.     rosuvastatin (CRESTOR) 5 MG tablet Take 5 mg by mouth once a week.     sildenafil (VIAGRA) 100 MG tablet Take 100 mg by mouth as needed.     No current facility-administered medications for this visit.  Allergies:   Patient has no known allergies.   Social History:  The patient  reports that he has never smoked. His smokeless tobacco use includes snuff. He reports current alcohol use. He reports that he does not use drugs.   Family History:   family history includes Hypertension in his father.    Review of Systems: Review of Systems  Constitutional: Negative.   Respiratory: Negative.    Cardiovascular: Negative.   Gastrointestinal: Negative.   Musculoskeletal:  Positive for back pain.  Neurological: Negative.   Psychiatric/Behavioral: Negative.    All other systems reviewed and are negative.    PHYSICAL EXAM: VS:  BP (!) 140/80 (BP Location: Left Arm, Patient  Position: Sitting, Cuff Size: Normal)   Pulse 84   Ht 6\' 1"  (1.854 m)   Wt 244 lb (110.7 kg)   SpO2 98%   BMI 32.19 kg/m  , BMI Body mass index is 32.19 kg/m. Constitutional:  oriented to person, place, and time. No distress.  HENT:  Head: Grossly normal Eyes:  no discharge. No scleral icterus.  Neck: No JVD, no carotid bruits  Cardiovascular: Regular rate and rhythm, no murmurs appreciated Pulmonary/Chest: Clear to auscultation bilaterally, no wheezes or rails Abdominal: Soft.  no distension.  no tenderness.  Musculoskeletal: Normal range of motion Neurological:  normal muscle tone. Coordination normal. No atrophy Skin: Skin warm and dry Psychiatric: normal affect, pleasant  Recent Labs: No results found for requested labs within last 365 days.    Lipid Panel Lab Results  Component Value Date   CHOL 222 (H) 02/01/2010   HDL 48 02/01/2010   LDLCALC 152 (H) 02/01/2010   TRIG 111 02/01/2010      Wt Readings from Last 3 Encounters:  04/23/23 244 lb (110.7 kg)  08/09/22 273 lb 12.8 oz (124.2 kg)  04/25/22 259 lb 6.4 oz (117.7 kg)       ASSESSMENT AND PLAN:  Nonischemic cardiomyopathy (HCC) Alcohol moderation recommended, previous concern for alcohol neuropathy Previous ejection fraction 35-40% Up to 45-50% on cardiac catheterization 2014 Appears euvolemic, repeat echocardiogram ordered to update our records  Mixed hyperlipidemia Reasonable cholesterol in 2022 Will defer to primary care  Essential hypertension Blood pressure is well controlled on today's visit. No changes made to the medications.  Uncomplicated alcohol dependence (HCC) Previously reported beer daily Recommend cessation Reports drinking heavily on a cruise recently  OSA on CPAP Weight remains high   Total encounter time more than 30 minutes  Greater than 50% was spent in counseling and coordination of care with the patient    No orders of the defined types were placed in this  encounter.    Signed, Dossie Arbour, M.D., Ph.D. 04/23/2023  [facility] [place], [place] [phone]

## 2023-04-23 ENCOUNTER — Ambulatory Visit: Payer: Medicare Other | Attending: Cardiovascular Disease | Admitting: Cardiovascular Disease

## 2023-04-23 ENCOUNTER — Encounter: Payer: Self-pay | Admitting: Cardiovascular Disease

## 2023-04-23 VITALS — BP 140/80 | HR 84 | Ht 73.0 in | Wt 244.0 lb

## 2023-04-23 DIAGNOSIS — I428 Other cardiomyopathies: Secondary | ICD-10-CM | POA: Insufficient documentation

## 2023-04-23 DIAGNOSIS — F1020 Alcohol dependence, uncomplicated: Secondary | ICD-10-CM | POA: Insufficient documentation

## 2023-04-23 DIAGNOSIS — G4733 Obstructive sleep apnea (adult) (pediatric): Secondary | ICD-10-CM | POA: Diagnosis not present

## 2023-04-23 DIAGNOSIS — I10 Essential (primary) hypertension: Secondary | ICD-10-CM | POA: Diagnosis not present

## 2023-04-23 DIAGNOSIS — E782 Mixed hyperlipidemia: Secondary | ICD-10-CM | POA: Insufficient documentation

## 2023-04-23 MED ORDER — DOXAZOSIN MESYLATE 8 MG PO TABS: ORAL_TABLET | ORAL | 3 refills | Status: DC

## 2023-04-23 MED ORDER — CARVEDILOL 6.25 MG PO TABS
ORAL_TABLET | ORAL | 3 refills | Status: DC
Start: 2023-04-23 — End: 2024-02-25

## 2023-04-23 MED ORDER — SILDENAFIL CITRATE 100 MG PO TABS: 100.0000 mg | ORAL_TABLET | ORAL | 3 refills | Status: DC | PRN

## 2023-04-23 MED ORDER — DOXAZOSIN MESYLATE 8 MG PO TABS
ORAL_TABLET | ORAL | 3 refills | Status: DC
Start: 2023-04-23 — End: 2023-04-23

## 2023-04-23 MED ORDER — LOSARTAN POTASSIUM 100 MG PO TABS: ORAL_TABLET | ORAL | 3 refills | Status: DC

## 2023-04-23 MED ORDER — AMLODIPINE BESYLATE 5 MG PO TABS
ORAL_TABLET | ORAL | 3 refills | Status: DC
Start: 2023-04-23 — End: 2024-02-25

## 2023-04-23 NOTE — Patient Instructions (Addendum)
   Medication Instructions:  No changes  If you need a refill on your cardiac medications before your next appointment, please call your pharmacy.   Lab work: No new labs needed  Testing/Procedures:   Your physician has requested that you have an echocardiogram. Echocardiography is a painless test that uses sound waves to create images of your heart. It provides your doctor with information about the size and shape of your heart and how well your heart's chambers and valves are working.   You may receive an ultrasound enhancing agent through an IV if needed to better visualize your heart during the echo. This procedure takes approximately one hour.  There are no restrictions for this procedure.  This will take place at [address] [facility] [address], [place] [address]  Follow-Up: At [facility], you and your health needs are our priority.  As part of our continuing mission to provide you with exceptional heart care, we have created designated Provider Care Teams.  These Care Teams include your primary Cardiologist (physician) and Advanced Practice Providers (APPs -  Physician Assistants and Nurse Practitioners) who all work together to provide you with the care you need, when you need it.  You will need a follow up appointment in 12 months  Providers on your designated Care Team:   Nicolasa Ducking, NP Eula Listen, PA-C Cadence Fransico Michael, [place]  COVID-19 Vaccine Information can be found at: [email or web address] For questions related to vaccine distribution or appointments, please email [email or web address] or call [phone].

## 2023-05-22 ENCOUNTER — Ambulatory Visit: Payer: Medicare Other | Attending: Cardiovascular Disease

## 2023-05-22 DIAGNOSIS — I428 Other cardiomyopathies: Secondary | ICD-10-CM | POA: Diagnosis present

## 2023-05-22 LAB — ECHOCARDIOGRAM COMPLETE
AR max vel: 2.95 cm2
AV Area VTI: 3.36 cm2
AV Area mean vel: 3.08 cm2
AV Mean grad: 4 mmHg
AV Peak grad: 7.8 mmHg
Ao pk vel: 1.4 m/s
Area-P 1/2: 3.05 cm2

## 2023-05-22 MED ORDER — PERFLUTREN LIPID MICROSPHERE
1.0000 mL | INTRAVENOUS | Status: AC | PRN
Start: 2023-05-22 — End: 2023-05-22
  Administered 2023-05-22: 3 mL via INTRAVENOUS

## 2023-05-28 ENCOUNTER — Telehealth: Payer: Self-pay | Admitting: Cardiovascular Disease

## 2023-05-28 DIAGNOSIS — Z79899 Other long term (current) drug therapy: Secondary | ICD-10-CM

## 2023-05-28 MED ORDER — ENTRESTO 49-51 MG PO TABS: 1.0000 | ORAL_TABLET | Freq: Two times a day (BID) | ORAL | 3 refills | Status: DC

## 2023-05-28 NOTE — Telephone Encounter (Signed)
The patient has been notified of the result along with recommendations. Pt verbalized understanding. All questions (if any) were answered   Medication list updated and lab order placed      Antonieta Iba, MD 05/26/2023  8:14 PM EDT   Echo Ejection fraction 25 to 30%, previously was 35% Numbers lower than it was in 2014 Medication changes can be made to try to increase the ejection fraction Would stop the losartan, start Entresto 49/51 mg twice daily Will need a BMP in 3 to 4 weeks time Additional medication changes following lab work include adding spironolactone, also adding [place] or Jardiance   Need to quit alcohol which potentially could be affecting the heart

## 2023-05-28 NOTE — Telephone Encounter (Signed)
° ° °  Pt is calling to get echo result °

## 2023-06-04 ENCOUNTER — Other Ambulatory Visit (HOSPITAL_COMMUNITY): Payer: Self-pay

## 2023-06-04 ENCOUNTER — Telehealth: Payer: Self-pay | Admitting: Cardiovascular Disease

## 2023-06-04 NOTE — Telephone Encounter (Signed)
Per test claim no PA required. Patient has a high cost due to deductible.

## 2023-06-04 NOTE — Telephone Encounter (Signed)
Pt c/o medication issue:  1. Name of Medication: Entresto  2. How are you currently taking this medication (dosage and times per day)?   3. Are you having a reaction (difficulty breathing--STAT)?   4. What is your medication issue? He said this medicine was $900 when he went to pick it up. He said he did not get it. He wants to now if there is something else that he can take?,

## 2023-06-11 MED ORDER — ENTRESTO 49-51 MG PO TABS: 1.0000 | ORAL_TABLET | Freq: Two times a day (BID) | ORAL | 3 refills | Status: DC

## 2023-06-11 NOTE — Telephone Encounter (Signed)
Called and spoke with patient. Informed patient of the following recommendation from Dr. Mariah Milling.  If unable to afford the Sherryll Burger would recommend we start irbesartan 300 mg daily Need to arrange follow-up in 1 month, will likely need to add additional medication If he can monitor blood pressure at home and bring numbers with him Thx TGollan         Patient verbalizes understanding. Patient that he will take the [facility] and asked to send the prescription in. Prescription sent to preferred pharmacy.

## 2023-08-12 ENCOUNTER — Ambulatory Visit: Payer: Medicare Other | Admitting: Adult Health

## 2023-09-18 ENCOUNTER — Ambulatory Visit (INDEPENDENT_AMBULATORY_CARE_PROVIDER_SITE_OTHER): Payer: Medicare Other | Admitting: Adult Health

## 2023-09-18 ENCOUNTER — Encounter: Payer: Self-pay | Admitting: Adult Health

## 2023-09-18 VITALS — BP 142/70 | HR 63 | Temp 97.7°F | Ht 73.0 in | Wt 263.2 lb

## 2023-09-18 DIAGNOSIS — J302 Other seasonal allergic rhinitis: Secondary | ICD-10-CM | POA: Diagnosis not present

## 2023-09-18 DIAGNOSIS — J309 Allergic rhinitis, unspecified: Secondary | ICD-10-CM | POA: Insufficient documentation

## 2023-09-18 DIAGNOSIS — G4733 Obstructive sleep apnea (adult) (pediatric): Secondary | ICD-10-CM | POA: Diagnosis not present

## 2023-09-18 DIAGNOSIS — Z23 Encounter for immunization: Secondary | ICD-10-CM | POA: Diagnosis not present

## 2023-09-18 NOTE — Assessment & Plan Note (Signed)
Advised to try Zyrtec at bedtime as needed.  Saline nasal rinses as needed

## 2023-09-18 NOTE — Assessment & Plan Note (Signed)
Excellent control and compliance on nocturnal CPAP.  Continue on current settings.  Order to DME company to evaluate machine and power supply.  Also order for new supplies.  Plan Patient Instructions  Continue on CPAP At bedtime   Order for CPAP evaluation at DME and order for new supplies  Wear all night long .  Do not drive if sleepy  Work on healthy weight loss.  Saline nasal rinses Twice daily   Saline nasal gel At bedtime   Zyrtec 10mg  daily As needed   Use caution with sedating medications.  Flu shot today  Follow up in 1 year and As needed   '

## 2023-09-18 NOTE — Progress Notes (Signed)
@Patient  ID: [name], male    DOB: 11-11-1956, 67 y.o.   MRN: [id number]  Chief Complaint  Patient presents with   Follow-up    Referring provider: Noni Saupe, MD  HPI: 67 yo male never smoker followed for OSA Medical history significant for nonischemic cardiomyopathy and CHF   TEST/EVENTS :  Previous split-night sleep study July 28, 2013 showed severe sleep apnea with AHI at 92/hour with optimal CPAP pressure at 14 cm H2O.   09/18/2023 Follow up : OSA  Patient returns for a 1 year follow-up.  Patient has underlying very severe sleep apnea is on nocturnal CPAP.  Patient says he wears a CPAP every single night cannot sleep without it.  Typically gets in about 8 to 9 hours.  Feels that he benefits from CPAP with decreased daytime sleepiness.  CPAP download shows excellent compliance with 100% usage.  Daily average usage at 9 hours.  Patient is on auto CPAP 5 to 20 cm H2O.  AHI 2.0/hour daily average pressure at 11.8 cm H2O.  Positive mask leaks.  Patient says that his machine has not been working properly lately.  The power cord has trouble connecting at times.  He also has not gotten any headgear supplies from his DME company this is causing his mask to not fit properly.  We have contacted his DME for an evaluation of his machine.  Have advised him to set up an appointment and take his machine and power cord and also to pick up new supplies.  Does complain of some increased allergy symptoms with nasal congestion and postnasal drip.  No fever or discolored mucus.   No Known Allergies  Immunization History  Administered Date(s) Administered   Fluad Trivalent(High Dose 65+) 09/18/2023   Influenza-Unspecified 09/16/2018   PFIZER(Purple Top)SARS-COV-2 Vaccination 01/29/2020, 02/16/2020    Past Medical History:  Diagnosis Date   Erectile dysfunction    Hyperlipidemia    Hypertension    Injury by electrocution    Injury of shoulder    LBBB (left bundle branch  block)    Chronic   Nonischemic cardiomyopathy (HCC)    a. 05/2013: echo w/ EF of 35-40% b. 06/2013: cath with mild luminal irregularities with no significant CAD. Mild inferior wall HK.   Obesity    Obstructive sleep apnea    a. on CPAP    Tobacco History: Social History   Tobacco Use  Smoking Status Never  Smokeless Tobacco Current   Types: Snuff   Ready to quit: Not Answered Counseling given: Not Answered   Outpatient Medications Prior to Visit  Medication Sig Dispense Refill   amLODipine (NORVASC) 5 MG tablet TAKE 1 TABLET(5 MG) BY MOUTH DAILY 90 tablet 3   carvedilol (COREG) 6.25 MG tablet TAKE 1 TABLET(6.25 MG) BY MOUTH TWICE DAILY WITH A MEAL 180 tablet 3   cyanocobalamin 100 MCG tablet Take 100 mcg by mouth daily.     doxazosin (CARDURA) 8 MG tablet TAKE 1 TABLET(8 MG) BY MOUTH DAILY 90 tablet 3   gabapentin (NEURONTIN) 300 MG capsule Take 300 mg by mouth at bedtime.     Multiple Vitamins-Minerals (ZINC PO) Take by mouth.     rosuvastatin (CRESTOR) 5 MG tablet Take 5 mg by mouth once a week.     sacubitril-valsartan (ENTRESTO) 49-51 MG Take 1 tablet by mouth 2 (two) times daily. 180 tablet 3   sildenafil (VIAGRA) 100 MG tablet Take 1 tablet (100 mg total) by mouth as needed.  10 tablet 3   cyclobenzaprine (FLEXERIL) 10 MG tablet Take 10 mg by mouth 3 (three) times daily as needed.     methocarbamol (ROBAXIN) 500 MG tablet Take 500 mg by mouth in the morning and at bedtime. (Patient not taking: Reported on 09/18/2023)     No facility-administered medications prior to visit.     Review of Systems:   Constitutional:   No  weight loss, night sweats,  Fevers, chills, fatigue, or  lassitude.  HEENT:   No headaches,  Difficulty swallowing,  Tooth/dental problems, or  Sore throat,                No sneezing, itching, ear ache,  +nasal congestion, post nasal drip,   CV:  No chest pain,  Orthopnea, PND, swelling in lower extremities, anasarca, dizziness, palpitations,  syncope.   GI  No heartburn, indigestion, abdominal pain, nausea, vomiting, diarrhea, change in bowel habits, loss of appetite, bloody stools.   Resp: No shortness of breath with exertion or at rest.  No excess mucus, no productive cough,  No non-productive cough,  No coughing up of blood.  No change in color of mucus.  No wheezing.  No chest wall deformity  Skin: no rash or lesions.  GU: no dysuria, change in color of urine, no urgency or frequency.  No flank pain, no hematuria   MS:  No joint pain or swelling.  No decreased range of motion.  No back pain.    Physical Exam  BP (!) 142/70 (BP Location: Left Arm, Cuff Size: Large)   Pulse 63   Temp 97.7 F (36.5 C) (Temporal)   Ht 6\' 1"  (1.854 m)   Wt 263 lb 3.2 oz (119.4 kg)   SpO2 97%   BMI 34.73 kg/m   GEN: A/Ox3; pleasant , NAD, well nourished    HEENT:  [place]/AT,  NOSE-clear, THROAT-clear, no lesions, no postnasal drip or exudate noted.   NECK:  Supple w/ fair ROM; no JVD; normal carotid impulses w/o bruits; no thyromegaly or nodules palpated; no lymphadenopathy.    RESP  Clear  P & A; w/o, wheezes/ rales/ or rhonchi. no accessory muscle use, no dullness to percussion  CARD:  RRR, no m/r/g, no peripheral edema, pulses intact, no cyanosis or clubbing.  GI:   Soft & nt; nml bowel sounds; no organomegaly or masses detected.   Musco: Warm bil, no deformities or joint swelling noted.   Neuro: alert, no focal deficits noted.    Skin: Warm, no lesions or rashes    Lab Results:    BNP No results found for: "BNP"  ProBNP No results found for: "PROBNP"  Imaging: No results found.  Administration History     None           No data to display          No results found for: "NITRICOXIDE"      Assessment & Plan:   OSA on CPAP Excellent control and compliance on nocturnal CPAP.  Continue on current settings.  Order to DME company to evaluate machine and power supply.  Also order for new  supplies.  Plan Patient Instructions  Continue on CPAP At bedtime   Order for CPAP evaluation at DME and order for new supplies  Wear all night long .  Do not drive if sleepy  Work on healthy weight loss.  Saline nasal rinses Twice daily   Saline nasal gel At bedtime   Zyrtec 10mg  daily As needed  Use caution with sedating medications.  Flu shot today  Follow up in 1 year and As needed   '   Allergic rhinitis Advised to try Zyrtec at bedtime as needed.  Saline nasal rinses as needed     Rubye Oaks, NP 09/18/2023

## 2023-09-18 NOTE — Patient Instructions (Addendum)
Continue on CPAP At bedtime   Order for CPAP evaluation at DME and order for new supplies  Wear all night long .  Do not drive if sleepy  Work on healthy weight loss.  Saline nasal rinses Twice daily   Saline nasal gel At bedtime   Zyrtec 10mg  daily As needed   Use caution with sedating medications.  Flu shot today  Follow up in 1 year and As needed

## 2023-10-08 ENCOUNTER — Other Ambulatory Visit: Payer: Self-pay | Admitting: Cardiovascular Disease

## 2024-02-03 ENCOUNTER — Other Ambulatory Visit: Payer: Self-pay | Admitting: Cardiovascular Disease

## 2024-02-25 ENCOUNTER — Telehealth: Payer: Self-pay | Admitting: Cardiovascular Disease

## 2024-02-25 DIAGNOSIS — I10 Essential (primary) hypertension: Secondary | ICD-10-CM

## 2024-02-25 DIAGNOSIS — I428 Other cardiomyopathies: Secondary | ICD-10-CM

## 2024-02-25 MED ORDER — CARVEDILOL 6.25 MG PO TABS
ORAL_TABLET | ORAL | 0 refills | Status: DC
Start: 2024-02-25 — End: 2024-04-23

## 2024-02-25 MED ORDER — ENTRESTO 49-51 MG PO TABS: 1.0000 | ORAL_TABLET | Freq: Two times a day (BID) | ORAL | 0 refills | Status: DC

## 2024-02-25 MED ORDER — AMLODIPINE BESYLATE 5 MG PO TABS
ORAL_TABLET | ORAL | 0 refills | Status: DC
Start: 2024-02-25 — End: 2024-04-23

## 2024-02-25 MED ORDER — DOXAZOSIN MESYLATE 8 MG PO TABS
ORAL_TABLET | ORAL | 0 refills | Status: DC
Start: 2024-02-25 — End: 2024-04-23

## 2024-02-25 NOTE — Telephone Encounter (Signed)
*  STAT* If patient is at the pharmacy, call can be transferred to refill team.   1. Which medications need to be refilled? (please list name of each medication and dose if known)    carvedilol (COREG) 6.25 MG tablet    amLODipine (NORVASC) 5 MG tablet  doxazosin (CARDURA) 8 MG tablet   sacubitril-valsartan (ENTRESTO) 49-51 MG    4. Which pharmacy/location (including street and city if local pharmacy) is medication to be sent to? [id number] - [place], [place] - [address] Phone: [phone]  Fax: [phone]       5. Do they need a 30 day or 90 day supply? 90

## 2024-04-09 ENCOUNTER — Other Ambulatory Visit: Payer: Self-pay | Admitting: Cardiovascular Disease

## 2024-04-09 DIAGNOSIS — I10 Essential (primary) hypertension: Secondary | ICD-10-CM

## 2024-04-21 NOTE — Progress Notes (Deleted)
 Cardiology Office Note    Date:  04/21/2024   ID:  Casey Christian., DOB Apr 17, 1956, MRN [id number]  PCP:  Casey Coons, MD  Cardiologist:  Casey Boyden, MD  Electrophysiologist:  None   Chief Complaint: Follow up  History of Present Illness:   Casey Kestel. is a 68 y.o. male with history of ***  ***   Labs independently reviewed: 02/2022 - Hgb 13.5, PLT 209, BUN 21, serum creatinine 0.8, potassium 4.2, albumin 4.3, AST/ALT normal 02/2021 - TC 160, TG 48, HDL 51, LDL 99  Past Medical History:  Diagnosis Date   Erectile dysfunction    Hyperlipidemia    Hypertension    Injury by electrocution    Injury of shoulder    LBBB (left bundle branch block)    Chronic   Nonischemic cardiomyopathy (HCC)    a. 05/2013: echo w/ EF of 35-40% b. 06/2013: cath with mild luminal irregularities with no significant CAD. Mild inferior wall HK.   Obesity    Obstructive sleep apnea    a. on CPAP    Past Surgical History:  Procedure Laterality Date   APPENDECTOMY     CARDIAC CATHETERIZATION  06/25/2013   [facility]; minor luminal irregularities, EF 45-50%;   KNEE SURGERY     KNEE SURGERY     meniscus   LUMBAR DISC SURGERY     SHOULDER SURGERY     UMBILICAL HERNIA REPAIR     WRIST SURGERY     right    Current Medications: No outpatient medications have been marked as taking for the 04/22/24 encounter (Appointment) with Roark Chick, PA-C.    Allergies:   Patient has no known allergies.   Social History   Socioeconomic History   Marital status: Married    Spouse name: Not on file   Number of children: Not on file   Years of education: Not on file   Highest education level: Not on file  Occupational History   Not on file  Tobacco Use   Smoking status: Never   Smokeless tobacco: Current    Types: Snuff  Vaping Use   Vaping status: Never Used  Substance and Sexual Activity   Alcohol use: Yes    Comment: socially   Drug use: No   Sexual activity: Not on file  Other  Topics Concern   Not on file  Social History Narrative   Not on file   Social Drivers of Health   Financial Resource Strain: Not on file  Food Insecurity: Not on file  Transportation Needs: Not on file  Physical Activity: Not on file  Stress: Not on file  Social Connections: Not on file     Family History:  The patient's family history includes Hypertension in his father.  ROS:   12-point review of systems is negative unless otherwise noted in the HPI.   EKGs/Labs/Other Studies Reviewed:    Studies reviewed were summarized above. The additional studies were reviewed today:  2D echo 05/22/2023: 1. Left ventricular ejection fraction, by estimation, is 25 to 30%. The  left ventricle has severely decreased function. The left ventricle  demonstrates global hypokinesis. Left ventricular diastolic parameters are  consistent with Grade I diastolic  dysfunction (impaired relaxation).   2. Right ventricular systolic function is low normal. The right  ventricular size is normal.   3. Left atrial size was moderately dilated.   4. The mitral valve is grossly normal. Mild mitral valve regurgitation.   5.  The aortic valve was not well visualized. Aortic valve regurgitation  is not visualized. Aortic valve sclerosis/calcification is present,  without any evidence of aortic stenosis. Aortic valve mean gradient  measures 4.0 mmHg.   6. Aortic dilatation noted. There is mild dilatation of the aortic root,  measuring 40 mm. There is mild dilatation of the ascending aorta,  measuring 42 mm.   7. The inferior vena cava is dilated in size with <50% respiratory  variability, suggesting right atrial pressure of 15 mmHg.   Comparison(s): EF 35%, moderate LVH, inferior/posterior wall hypokinesis.     EKG:  EKG is ordered today.  The EKG ordered today demonstrates ***  Recent Labs: No results found for requested labs within last 365 days.  Recent Lipid Panel    Component Value Date/Time    CHOL 222 (H) 02/01/2010 2032   TRIG 111 02/01/2010 2032   HDL 48 02/01/2010 2032   CHOLHDL 4.6 Ratio 02/01/2010 2032   VLDL 22 02/01/2010 2032   LDLCALC 152 (H) 02/01/2010 2032    PHYSICAL EXAM:    VS:  There were no vitals taken for this visit.  BMI: There is no height or weight on file to calculate BMI.  Physical Exam  Wt Readings from Last 3 Encounters:  09/18/23 263 lb 3.2 oz (119.4 kg)  04/23/23 244 lb (110.7 kg)  08/09/22 273 lb 12.8 oz (124.2 kg)     ASSESSMENT & PLAN:   ***   {Are you ordering a CV Procedure (e.g. stress test, cath, DCCV, TEE, etc)?   Press F2        :[id number]}     Disposition: F/u with Dr. Gollan or an APP in ***.   Medication Adjustments/Labs and Tests Ordered: Current medicines are reviewed at length with the patient today.  Concerns regarding medicines are outlined above. Medication changes, Labs and Tests ordered today are summarized above and listed in the Patient Instructions accessible in Encounters.   Signed, Varney Gentleman, PA-C 04/21/2024 9:17 AM     [facility] [place] [address] [place], [place] [address] [phone]

## 2024-04-22 ENCOUNTER — Ambulatory Visit: Admitting: Physician Assistant

## 2024-04-22 ENCOUNTER — Telehealth: Payer: Self-pay | Admitting: Cardiovascular Disease

## 2024-04-22 DIAGNOSIS — I10 Essential (primary) hypertension: Secondary | ICD-10-CM

## 2024-04-22 DIAGNOSIS — I428 Other cardiomyopathies: Secondary | ICD-10-CM

## 2024-04-22 NOTE — Telephone Encounter (Signed)
*  STAT* If patient is at the pharmacy, call can be transferred to refill team.   1. Which medications need to be refilled? (please list name of each medication and dose if known)  amLODipine  (NORVASC ) 5 MG tablet  carvedilol  (COREG ) 6.25 MG tablet  sacubitril-valsartan (ENTRESTO ) 49-51 MG  doxazosin  (CARDURA ) 8 MG tablet  sildenafil  (VIAGRA ) 100 MG tablet   2. Which pharmacy/location (including street and city if local pharmacy) is medication to be sent to? [facility] [phone] - [place], [place] - [address]   3. Do they need a 30 day or 90 day supply? 90

## 2024-04-23 MED ORDER — DOXAZOSIN MESYLATE 8 MG PO TABS: ORAL_TABLET | ORAL | 0 refills | Status: DC

## 2024-04-23 MED ORDER — CARVEDILOL 6.25 MG PO TABS: ORAL_TABLET | ORAL | 0 refills | Status: DC

## 2024-04-23 MED ORDER — ENTRESTO 49-51 MG PO TABS: 1.0000 | ORAL_TABLET | Freq: Two times a day (BID) | ORAL | 0 refills | Status: DC

## 2024-04-23 MED ORDER — AMLODIPINE BESYLATE 5 MG PO TABS: ORAL_TABLET | ORAL | 0 refills | Status: DC

## 2024-04-23 NOTE — Telephone Encounter (Signed)
 Pt's medication was sent to pt's pharmacy as requested. Confirmation received. Pt is also asking for a refill on medication sildenafil . Would Dr. Gollan like to refill this non cardiac medication? Please address

## 2024-04-24 MED ORDER — SILDENAFIL CITRATE 100 MG PO TABS: 100.0000 mg | ORAL_TABLET | ORAL | 3 refills | Status: DC | PRN

## 2024-04-28 ENCOUNTER — Ambulatory Visit: Admitting: Cardiovascular Disease

## 2024-05-06 ENCOUNTER — Other Ambulatory Visit: Payer: Self-pay

## 2024-05-06 DIAGNOSIS — I10 Essential (primary) hypertension: Secondary | ICD-10-CM

## 2024-05-06 MED ORDER — DOXAZOSIN MESYLATE 8 MG PO TABS: ORAL_TABLET | ORAL | 1 refills | Status: DC

## 2024-06-26 ENCOUNTER — Telehealth: Payer: Self-pay | Admitting: Pharmacy Technician

## 2024-06-26 ENCOUNTER — Ambulatory Visit: Attending: Cardiovascular Disease | Admitting: Cardiovascular Disease

## 2024-06-26 ENCOUNTER — Other Ambulatory Visit (HOSPITAL_COMMUNITY): Payer: Self-pay

## 2024-06-26 ENCOUNTER — Encounter: Payer: Self-pay | Admitting: Cardiovascular Disease

## 2024-06-26 VITALS — BP 124/64 | HR 81 | Ht 73.0 in | Wt 267.2 lb

## 2024-06-26 DIAGNOSIS — E782 Mixed hyperlipidemia: Secondary | ICD-10-CM | POA: Diagnosis present

## 2024-06-26 DIAGNOSIS — I10 Essential (primary) hypertension: Secondary | ICD-10-CM | POA: Diagnosis present

## 2024-06-26 DIAGNOSIS — Z79899 Other long term (current) drug therapy: Secondary | ICD-10-CM | POA: Diagnosis present

## 2024-06-26 DIAGNOSIS — G4733 Obstructive sleep apnea (adult) (pediatric): Secondary | ICD-10-CM | POA: Diagnosis present

## 2024-06-26 DIAGNOSIS — I428 Other cardiomyopathies: Secondary | ICD-10-CM | POA: Insufficient documentation

## 2024-06-26 MED ORDER — DAPAGLIFLOZIN PROPANEDIOL 10 MG PO TABS: 10.0000 mg | ORAL_TABLET | Freq: Every day | ORAL | 3 refills | Status: DC

## 2024-06-26 MED ORDER — CARVEDILOL 12.5 MG PO TABS: 12.5000 mg | ORAL_TABLET | Freq: Two times a day (BID) | ORAL | 3 refills | Status: DC

## 2024-06-26 MED ORDER — SPIRONOLACTONE 25 MG PO TABS: 25.0000 mg | ORAL_TABLET | Freq: Every day | ORAL | 3 refills | Status: DC

## 2024-06-26 MED ORDER — DAPAGLIFLOZIN PROPANEDIOL 10 MG PO TABS: 10.0000 mg | ORAL_TABLET | Freq: Every day | ORAL | Status: DC

## 2024-06-26 NOTE — Progress Notes (Signed)
 Office Note  Date:  06/26/2024   ID:  Casey JONELLE Beecher Mickey., DOB [birth year], MRN [id number]  PCP:  Nichole Senior, MD   Chief Complaint  Patient presents with   12 month follow up     Doing well.     HPI:  Casey Masden. is a 68 y.o. male with PMHx of HTN,  HLD,  LBBB Smoker, not cig significant alcohol history drinking at least 6 beers per day ,  erectile dysfunction,  OSA who wears CPAP , obesity  cardiac catheterization with no significant disease in 2014 Echocardiogram 2014 ejection fraction 35-40% Cardiac catheterization 2014 ejection fraction 45-50% Echo June 2024 EF 25 to 30% who presents today for follow-up of his hypertension.  Last seen in clinic by myself 5/24 Periodic tightness in shoulder in neck Takes flexeril provided by orthopedics  Recent lab work reviewed Going on a cruise in 2 weeks, has the drinking package History of regular alcohol  Works in Aeronautical engineer  History of Statin myalgias, lipitor Tolerating Crestor 5 daily  Non-smoker, no diabetes  Trace lower extremity edema above the sock line  EKG personally reviewed by myself on todays visit EKG Interpretation Date/Time:  Friday June 26 2024 08:40:55 EDT Ventricular Rate:  81 PR Interval:  150 QRS Duration:  150 QT Interval:  430 QTC Calculation: 499 R Axis:   46  Text Interpretation: Sinus rhythm with sinus arrhythmia with occasional Premature atrial complexes Left bundle branch block When compared with ECG of 06-Dec-2011 14:19, No significant change was found Confirmed by Perla Lye [phone]) on 06/26/2024 8:43:01 AM     Other past medical history  Previous stress test in 2012 which revealed no evidenced of ischemia, but reduced EF.  A follow-up 2D echo indicated EF 50-55% with mildly dilated LV and mild LVH, otherwise normal study.    PMH:   has a past medical history of Erectile dysfunction, Hyperlipidemia, Hypertension, Injury by electrocution, Injury of shoulder, LBBB (left  bundle branch block), Nonischemic cardiomyopathy (HCC), Obesity, and Obstructive sleep apnea.  PSH:    Past Surgical History:  Procedure Laterality Date   APPENDECTOMY     CARDIAC CATHETERIZATION  06/25/2013   [facility]; minor luminal irregularities, EF 45-50%;   KNEE SURGERY     KNEE SURGERY     meniscus   LUMBAR DISC SURGERY     SHOULDER SURGERY     UMBILICAL HERNIA REPAIR     WRIST SURGERY     right    Current Outpatient Medications  Medication Sig Dispense Refill   amLODipine  (NORVASC ) 5 MG tablet TAKE 1 TABLET(5 MG) BY MOUTH DAILY 90 tablet 0   carvedilol  (COREG ) 6.25 MG tablet TAKE 1 TABLET(6.25 MG) BY MOUTH TWICE DAILY WITH A MEAL 180 tablet 0   cyanocobalamin  100 MCG tablet Take 100 mcg by mouth daily.     cyclobenzaprine (FLEXERIL) 10 MG tablet Take 10 mg by mouth 3 (three) times daily as needed.     doxazosin  (CARDURA ) 8 MG tablet TAKE 1 TABLET(8 MG) BY MOUTH DAILY 90 tablet 1   gabapentin (NEURONTIN) 300 MG capsule Take 300 mg by mouth at bedtime.     methocarbamol (ROBAXIN) 500 MG tablet Take 500 mg by mouth in the morning and at bedtime.     Multiple Vitamins-Minerals (ZINC PO) Take by mouth.     rosuvastatin (CRESTOR) 5 MG tablet Take 5 mg by mouth once a week.     sacubitril-valsartan (ENTRESTO ) 49-51 MG Take 1 tablet by mouth  2 (two) times daily. 180 tablet 0   sildenafil  (VIAGRA ) 100 MG tablet Take 1 tablet (100 mg total) by mouth as needed. 10 tablet 3   No current facility-administered medications for this visit.    Allergies:   Patient has no known allergies.   Social History:  The patient  reports that he has never smoked. His smokeless tobacco use includes snuff. He reports current alcohol use. He reports that he does not use drugs.   Family History:   family history includes Hypertension in his father.    Review of Systems: Review of Systems  Constitutional: Negative.   Respiratory: Negative.    Cardiovascular: Negative.   Gastrointestinal: Negative.    Musculoskeletal:  Positive for back pain.  Neurological: Negative.   Psychiatric/Behavioral: Negative.    All other systems reviewed and are negative.   PHYSICAL EXAM: VS:  BP 124/64 (BP Location: Left Arm, Patient Position: Sitting, Cuff Size: Normal)   Pulse 81   Ht 6' 1 (1.854 m)   Wt 267 lb 4 oz (121.2 kg)   SpO2 96%   BMI 35.26 kg/m  , BMI Body mass index is 35.26 kg/m. Constitutional:  oriented to person, place, and time. No distress.  HENT:  Head: Grossly normal Eyes:  no discharge. No scleral icterus.  Neck: No JVD, no carotid bruits  Cardiovascular: Regular rate and rhythm, no murmurs appreciated Pulmonary/Chest: Clear to auscultation bilaterally, no wheezes or rales Abdominal: Soft.  no distension.  no tenderness.  Musculoskeletal: Normal range of motion Neurological:  normal muscle tone. Coordination normal. No atrophy Skin: Skin warm and dry Psychiatric: normal affect, pleasant   Recent Labs: No results found for requested labs within last 365 days.   Lipid Panel Lab Results  Component Value Date   CHOL 222 (H) 02/01/2010   HDL 48 02/01/2010   LDLCALC 152 (H) 02/01/2010   TRIG 111 02/01/2010      Wt Readings from Last 3 Encounters:  06/26/24 267 lb 4 oz (121.2 kg)  09/18/23 263 lb 3.2 oz (119.4 kg)  04/23/23 244 lb (110.7 kg)      ASSESSMENT AND PLAN:  Nonischemic cardiomyopathy (HCC) History of alcohol Most recent ejection fraction 25 to 30% in 2024 Alcohol cessation recommended To follow GDMT, recommend he hold amlodipine  Increase carvedilol  12.5 twice daily, start spironolactone  25 daily, add Farxiga  10 daily. We will have our pharmacist look into cost of Entresto  and Farxiga  Reports currently pain $180 per month for Entresto   Mixed hyperlipidemia Lipid panel ordered today  Essential hypertension Medication changes as above  Uncomplicated alcohol dependence (HCC) Previously reported beer daily Recommend cessation Discussed again  on today's visit  OSA on CPAP Weight remains high    Orders Placed This Encounter  Procedures   EKG 12-Lead     Signed, Velinda Lunger, M.D., Ph.D. 06/26/2024  [facility] [place], [place] [phone]

## 2024-06-26 NOTE — Patient Instructions (Addendum)
 Medication Instructions:   Stop amlodipine  Increase carvedilol  12.5 twice daily,  start spironolactone  25 daily Start  Farxiga  10 daily  If you need a refill on your cardiac medications before your next appointment, please call your pharmacy.   Lab work:  CMP, CBC, lipids  Testing/Procedures:  Your physician has requested that you have an echocardiogram. Echocardiography is a painless test that uses sound waves to create images of your heart. It provides your doctor with information about the size and shape of your heart and how well your heart's chambers and valves are working.   You may receive an ultrasound enhancing agent through an IV if needed to better visualize your heart during the echo. This procedure takes approximately one hour.  There are no restrictions for this procedure.  This will take place at [address] [facility] [address], [place] [address]  Please note: We ask at that you not bring children with you during ultrasound (echo/ vascular) testing. Due to room size and safety concerns, children are not allowed in the ultrasound rooms during exams. Our front office staff cannot provide observation of children in our lobby area while testing is being conducted. An adult accompanying a patient to their appointment will only be allowed in the ultrasound room at the discretion of the ultrasound technician under special circumstances. We apologize for any inconvenience.   Follow-Up: At [facility], you and your health needs are our priority.  As part of our continuing mission to provide you with exceptional heart care, we have created designated Provider Care Teams.  These Care Teams include your primary Cardiologist (physician) and Advanced Practice Providers (APPs -  Physician Assistants and Nurse Practitioners) who all work together to provide you with the care you need, when you need it.  You will need a follow up appointment in 6 months  Providers on  your designated Care Team:   Lonni Meager, NP Bernardino Bring, PA-C Cadence [place], [place]  COVID-19 Vaccine Information can be found at: [email or web address] For questions related to vaccine distribution or appointments, please email [email or web address] or call [phone].

## 2024-06-26 NOTE — Telephone Encounter (Signed)
 Patient Advocate Encounter   The patient was approved for a [employer] grant that will help cover the cost of Entresto  Total amount awarded, 4500.00.  Effective: 05/27/24 - 05/26/25   APW:[id number] ERW:[id number] Hmnle:[id number] PI:[id number]  [employer] ID: [id number]   Pharmacy provided with approval and processing information. Patient informed via mychart

## 2024-06-26 NOTE — Telephone Encounter (Signed)
 Patient Advocate Encounter   The patient was approved for a [employer] grant that will help cover the cost of Farxiga  Total amount awarded, 4500.00.  Effective: 05/27/24 - 05/26/25   APW:[id number] ERW:[id number] Hmnle:[id number] PI:[id number]  [employer] ID: [id number]   Pharmacy provided with approval and processing information. Patient informed via mychart

## 2024-06-27 LAB — COMPREHENSIVE METABOLIC PANEL WITH GFR
ALT: 16 IU/L (ref 0–44)
AST: 17 IU/L (ref 0–40)
Albumin: 4.3 g/dL (ref 3.9–4.9)
Alkaline Phosphatase: 65 IU/L (ref 44–121)
BUN/Creatinine Ratio: 25 — ABNORMAL HIGH (ref 10–24)
BUN: 18 mg/dL (ref 8–27)
Bilirubin Total: 0.6 mg/dL (ref 0.0–1.2)
CO2: 20 mmol/L (ref 20–29)
Calcium: 8.7 mg/dL (ref 8.6–10.2)
Chloride: 105 mmol/L (ref 96–106)
Creatinine, Ser: 0.72 mg/dL — ABNORMAL LOW (ref 0.76–1.27)
Globulin, Total: 1.8 g/dL (ref 1.5–4.5)
Glucose: 106 mg/dL — ABNORMAL HIGH (ref 70–99)
Potassium: 4.3 mmol/L (ref 3.5–5.2)
Sodium: 144 mmol/L (ref 134–144)
Total Protein: 6.1 g/dL (ref 6.0–8.5)
eGFR: 100 mL/min/1.73 (ref >59)

## 2024-06-27 LAB — LIPID PANEL
Chol/HDL Ratio: 2.9 ratio (ref 0.0–5.0)
Cholesterol, Total: 168 mg/dL (ref 100–199)
HDL: 58 mg/dL (ref >39)
LDL Chol Calc (NIH): 101 mg/dL — ABNORMAL HIGH (ref 0–99)
Triglycerides: 40 mg/dL (ref 0–149)
VLDL Cholesterol Cal: 9 mg/dL (ref 5–40)

## 2024-06-27 LAB — CBC
Hematocrit: 41.6 % (ref 37.5–51.0)
Hemoglobin: 13.4 g/dL (ref 13.0–17.7)
MCH: 30.9 pg (ref 26.6–33.0)
MCHC: 32.2 g/dL (ref 31.5–35.7)
MCV: 96 fL (ref 79–97)
Platelets: 187 x10E3/uL (ref 150–450)
RBC: 4.33 x10E6/uL (ref 4.14–5.80)
RDW: 12.3 % (ref 11.6–15.4)
WBC: 5.5 x10E3/uL (ref 3.4–10.8)

## 2024-06-28 ENCOUNTER — Ambulatory Visit: Payer: Self-pay | Admitting: Cardiovascular Disease

## 2024-07-02 ENCOUNTER — Other Ambulatory Visit (HOSPITAL_COMMUNITY): Payer: Self-pay

## 2024-07-03 ENCOUNTER — Other Ambulatory Visit: Payer: Self-pay | Admitting: Cardiovascular Disease

## 2024-07-29 ENCOUNTER — Other Ambulatory Visit

## 2024-08-09 ENCOUNTER — Other Ambulatory Visit: Payer: Self-pay | Admitting: Cardiovascular Disease

## 2024-08-09 DIAGNOSIS — I10 Essential (primary) hypertension: Secondary | ICD-10-CM

## 2024-08-09 DIAGNOSIS — I428 Other cardiomyopathies: Secondary | ICD-10-CM

## 2024-08-10 ENCOUNTER — Other Ambulatory Visit: Payer: Self-pay | Admitting: Cardiovascular Disease

## 2024-08-10 DIAGNOSIS — I10 Essential (primary) hypertension: Secondary | ICD-10-CM

## 2024-08-10 DIAGNOSIS — I428 Other cardiomyopathies: Secondary | ICD-10-CM

## 2024-08-11 ENCOUNTER — Other Ambulatory Visit: Payer: Self-pay | Admitting: Cardiovascular Disease

## 2024-08-11 DIAGNOSIS — I428 Other cardiomyopathies: Secondary | ICD-10-CM

## 2024-08-11 DIAGNOSIS — I10 Essential (primary) hypertension: Secondary | ICD-10-CM

## 2024-09-17 ENCOUNTER — Ambulatory Visit: Payer: Medicare Other | Admitting: Adult Health

## 2024-09-17 DIAGNOSIS — G4733 Obstructive sleep apnea (adult) (pediatric): Secondary | ICD-10-CM

## 2024-09-29 ENCOUNTER — Other Ambulatory Visit: Payer: Self-pay | Admitting: Cardiovascular Disease

## 2024-09-29 DIAGNOSIS — I10 Essential (primary) hypertension: Secondary | ICD-10-CM

## 2024-09-29 DIAGNOSIS — I428 Other cardiomyopathies: Secondary | ICD-10-CM

## 2024-09-30 ENCOUNTER — Ambulatory Visit: Attending: Cardiovascular Disease

## 2024-09-30 DIAGNOSIS — I428 Other cardiomyopathies: Secondary | ICD-10-CM | POA: Diagnosis present

## 2024-09-30 LAB — ECHOCARDIOGRAM COMPLETE
AR max vel: 2.8 cm2
AV Area VTI: 2.37 cm2
AV Area mean vel: 2.51 cm2
AV Mean grad: 8.5 mmHg
AV Peak grad: 14.5 mmHg
Ao pk vel: 1.91 m/s
Area-P 1/2: 3.77 cm2
S' Lateral: 6.14 cm

## 2024-10-03 ENCOUNTER — Other Ambulatory Visit: Payer: Self-pay | Admitting: Cardiovascular Disease

## 2024-10-05 ENCOUNTER — Other Ambulatory Visit: Payer: Self-pay | Admitting: Emergency Medicine

## 2024-10-05 DIAGNOSIS — I428 Other cardiomyopathies: Secondary | ICD-10-CM

## 2024-10-05 DIAGNOSIS — I420 Dilated cardiomyopathy: Secondary | ICD-10-CM

## 2024-10-06 ENCOUNTER — Encounter: Payer: Self-pay | Admitting: Nurse Practitioner

## 2024-10-06 ENCOUNTER — Ambulatory Visit: Admitting: Nurse Practitioner

## 2024-10-06 ENCOUNTER — Telehealth: Payer: Self-pay | Admitting: Cardiovascular Disease

## 2024-10-06 VITALS — BP 122/70 | HR 57 | Temp 98.3°F | Ht 73.0 in | Wt 267.8 lb

## 2024-10-06 DIAGNOSIS — G4733 Obstructive sleep apnea (adult) (pediatric): Secondary | ICD-10-CM | POA: Diagnosis not present

## 2024-10-06 DIAGNOSIS — I5020 Unspecified systolic (congestive) heart failure: Secondary | ICD-10-CM | POA: Insufficient documentation

## 2024-10-06 NOTE — Assessment & Plan Note (Signed)
 Severe OSA on CPAP. Excellent compliance and control. Receives benefit from use. Residual daytime fatigue associated with underlying HF. Aware of risks of untreated OSA. Encouraged to continue utilizing nightly. Understands proper care/use of device. Healthy weight management encouraged. Safe driving practices reviewed.   Patient Instructions  Continue to use CPAP every night, minimum of 4-6 hours a night.  Change equipment as directed. Wash your tubing with warm soap and water daily, hang to dry. Wash humidifier portion weekly. Use bottled, distilled water and change daily Be aware of reduced alertness and do not drive or operate heavy machinery if experiencing this or drowsiness.  Exercise encouraged, as tolerated. Healthy weight management discussed.  Avoid or decrease alcohol consumption and medications that make you more sleepy, if possible. Notify if persistent daytime sleepiness occurs even with consistent use of PAP therapy.  Change CPAP supplies... Every month Mask cushions and/or nasal pillows CPAP machine filters Every 3 months Mask frame (not including the headgear) CPAP tubing Every 6 months Mask headgear Chin strap (if applicable) Humidifier water tub  Follow up in 1 year with Katie Aarron Wierzbicki,NP, or sooner, if needed

## 2024-10-06 NOTE — Telephone Encounter (Signed)
 Patient would like a phone call explaining why referred to heart failure, please assist

## 2024-10-06 NOTE — Assessment & Plan Note (Signed)
 Euvolemic on exam. Aware of red flag symptoms. Awaiting appt with HF clinic.

## 2024-10-06 NOTE — Patient Instructions (Signed)
 Continue to use CPAP every night, minimum of 4-6 hours a night.  Change equipment as directed. Wash your tubing with warm soap and water daily, hang to dry. Wash humidifier portion weekly. Use bottled, distilled water and change daily Be aware of reduced alertness and do not drive or operate heavy machinery if experiencing this or drowsiness.  Exercise encouraged, as tolerated. Healthy weight management discussed.  Avoid or decrease alcohol consumption and medications that make you more sleepy, if possible. Notify if persistent daytime sleepiness occurs even with consistent use of PAP therapy.  Change CPAP supplies... Every month Mask cushions and/or nasal pillows CPAP machine filters Every 3 months Mask frame (not including the headgear) CPAP tubing Every 6 months Mask headgear Chin strap (if applicable) Humidifier water tub  Follow up in 1 year with Casey Quintan Saldivar,NP, or sooner, if needed.

## 2024-10-06 NOTE — Telephone Encounter (Signed)
 Spoke with patient and notified him of the following.  Echocardiogram Severely reduced ejection fraction 20 to 25% Normal RV size and function Mild dilation aortic root 4.1 cm Appears relatively unchanged from May 2024   Would moderate alcohol intake which can weaken the heart pump function Would suggest referral to advanced heart failure clinic in [facility]    Patient verbalizes understanding.

## 2024-10-06 NOTE — Telephone Encounter (Signed)
 Please advise if ok to refill request.

## 2024-10-06 NOTE — Progress Notes (Signed)
 @Patient  ID: Casey JONELLE Beecher Mickey., male    DOB: 1956-02-14, 68 y.o.   MRN: [id number]  Chief Complaint  Patient presents with   Sleep Apnea    CPAP is good. No problem with the mask or pressure.     Referring provider: Nichole Senior, MD  HPI: 68 year old male, never smoker followed for OSA on CPAP. Past medical history significant for NICM, HTN, HFrEF, HLD, ETOH dependence, ED.   TEST/EVENTS:  07/29/2023 split night: AHI 92/h >> severe OSA with optimal control at 14 cmH2O  09/18/2023: OV with Madelin Stank, NP. Underlying severe OSA, on nocturnal CPAP. Wears CPAP every night. Can't sleep without it. Typically gets about 8-9 hours. Feels that he benefits from CPAP with decreased daytime sleepiness. CPAP download with 100% usage. Daily average usage at 9 hr. Positive mask leaks. Having trouble with power cord for CPAP. Has not gotten any headgear supplies from his DME company. Advised him to schedule appt with DME.   10/06/2024: Today - follow up Discussed the use of AI scribe software for clinical note transcription with the patient, who gave verbal consent to proceed.  History of Present Illness Casey Hess is a 68 year old male who presents for follow up.   He reports using his CPAP therapy nightly with good benefit. Sleeps well with it. Couldn't sleep without it. There are some leaks in the mask, but they are not significant. He is currently using a mask with nasal pillows and is awaiting new headgear due to issues with the Velcro strap. No issues with drowsy driving are reported. No snoring, sleep parasomnias, morning headaches. He does feel fatigued most days, due to his HF, which was recently diagnosed.   He has a history of dilated cardiomyopathy with an echocardiogram showing an ejection fraction of 20-25%. There is no swelling in his legs, chest pain, orthopnea, PND. Breathing is doing okay. He's awaiting an appt with the HF clinic.   He confirms receiving  his flu shot this year.  09/05/2024-10/14/2024: CPAP 5-20 cmH2O 30/30 days; 97% >4 hr; average use 8 hr 30 min Pressure 95th 12.6 Leaks 95th 27.7 AHI 3.1    No Known Allergies  Immunization History  Administered Date(s) Administered   Fluad Trivalent(High Dose 65+) 09/18/2023, 10/01/2024   Influenza-Unspecified 09/16/2018   PFIZER(Purple Top)SARS-COV-2 Vaccination 01/29/2020, 02/16/2020    Past Medical History:  Diagnosis Date   Erectile dysfunction    Hyperlipidemia    Hypertension    Injury by electrocution    Injury of shoulder    LBBB (left bundle branch block)    Chronic   Nonischemic cardiomyopathy (HCC)    a. 05/2013: echo w/ EF of 35-40% b. 06/2013: cath with mild luminal irregularities with no significant CAD. Mild inferior wall HK.   Obesity    Obstructive sleep apnea    a. on CPAP    Tobacco History: Social History   Tobacco Use  Smoking Status Never  Smokeless Tobacco Current   Types: Snuff   Ready to quit: Not Answered Counseling given: Not Answered   Outpatient Medications Prior to Visit  Medication Sig Dispense Refill   carvedilol  (COREG ) 12.5 MG tablet Take 1 tablet (12.5 mg total) by mouth 2 (two) times daily with a meal. 180 tablet 3   cyanocobalamin  100 MCG tablet Take 100 mcg by mouth daily.     cyclobenzaprine (FLEXERIL) 10 MG tablet Take 10 mg by mouth 3 (three) times daily as needed.  dapagliflozin  propanediol (FARXIGA ) 10 MG TABS tablet Take 1 tablet (10 mg total) by mouth daily before breakfast. 90 tablet 3   dapagliflozin  propanediol (FARXIGA ) 10 MG TABS tablet Take 1 tablet (10 mg total) by mouth daily before breakfast. 14 tablet    doxazosin  (CARDURA ) 8 MG tablet TAKE 1 TABLET(8 MG) BY MOUTH DAILY 90 tablet 1   gabapentin (NEURONTIN) 300 MG capsule Take 300 mg by mouth at bedtime.     methocarbamol (ROBAXIN) 500 MG tablet Take 500 mg by mouth in the morning and at bedtime.     Multiple Vitamins-Minerals (ZINC PO) Take by mouth.      rosuvastatin (CRESTOR) 5 MG tablet Take 5 mg by mouth once a week.     sacubitril-valsartan (ENTRESTO ) 49-51 MG Take 1 tablet by mouth 2 (two) times daily. 180 tablet 0   sildenafil  (VIAGRA ) 100 MG tablet Take 1 tablet (100 mg total) by mouth as needed. 10 tablet 3   spironolactone  (ALDACTONE ) 25 MG tablet Take 1 tablet (25 mg total) by mouth daily. 90 tablet 3   No facility-administered medications prior to visit.     Review of Systems: as above    Physical Exam:  BP 122/70   Pulse (!) 57   Temp 98.3 F (36.8 C)   Ht 6' 1 (1.854 m)   Wt 267 lb 12.8 oz (121.5 kg)   SpO2 95%   BMI 35.33 kg/m   GEN: Pleasant, interactive, well-appearing; obese; in no acute distress HEENT:  Normocephalic and atraumatic. PERRLA. Sclera white. Nasal turbinates pink, moist and patent bilaterally. No rhinorrhea present. Oropharynx pink and moist, without exudate or edema. No lesions, ulcerations, or postnasal drip.  NECK:  Supple w/ fair ROM. No JVD present. No lymphadenopathy.   CV: RRR, no m/r/g, no peripheral edema. Pulses intact, +2 bilaterally. No cyanosis, pallor or clubbing. PULMONARY:  Unlabored, regular breathing. Clear bilaterally A&P w/o wheezes/rales/rhonchi. No accessory muscle use.  GI: BS present and normoactive. Soft, non-tender to palpation. .  Neuro: A/Ox3. No focal deficits noted.   Skin: Warm, no lesions or rashe Psych: Normal affect and behavior. Judgement and thought content appropriate.     Lab Results:  CBC    Component Value Date/Time   WBC 5.5 06/26/2024 0931   RBC 4.33 06/26/2024 0931   HGB 13.4 06/26/2024 0931   HCT 41.6 06/26/2024 0931   PLT 187 06/26/2024 0931   MCV 96 06/26/2024 0931   MCH 30.9 06/26/2024 0931   MCHC 32.2 06/26/2024 0931   RDW 12.3 06/26/2024 0931   LYMPHSABS 1.4 06/24/2013 0828   EOSABS 0.2 06/24/2013 0828   BASOSABS 0.1 06/24/2013 0828    BMET    Component Value Date/Time   NA 144 06/26/2024 0931   K 4.3 06/26/2024 0931   CL  105 06/26/2024 0931   CO2 20 06/26/2024 0931   GLUCOSE 106 (H) 06/26/2024 0931   GLUCOSE 108 (H) 02/01/2010 2032   BUN 18 06/26/2024 0931   CREATININE 0.72 (L) 06/26/2024 0931   CALCIUM  8.7 06/26/2024 0931   GFRNONAA 87 06/24/2013 0828   GFRAA 101 06/24/2013 0828    BNP No results found for: BNP   Imaging:  ECHOCARDIOGRAM COMPLETE Result Date: 09/30/2024    ECHOCARDIOGRAM REPORT   Patient Name:   Nathanel Tallman. Date of Exam: 09/30/2024 Medical Rec #:  [id number]          Height:       73.0 in Accession #:    [id number]  Weight:       267.2 lb Date of Birth:  [birth year]         BSA:          2.434 m Patient Age:    67 years           BP:           124/64 mmHg Patient Gender: M                  HR:           97 bpm. Exam Location:  [place] Procedure: 2D Echo, Cardiac Doppler and Color Doppler (Both Spectral and Color            Flow Doppler were utilized during procedure). Indications:    I42.80 Non-ischemic cardiomyopathy  History:        Patient has prior history of Echocardiogram examinations, most                 recent 05/22/2023. CHF and Cardiomyopathy, Abnormal ECG,                 Arrythmias:LBBB, Signs/Symptoms:Chest Pain, Shortness of Breath,                 Dyspnea and Edema; Risk Factors:Hypertension, Dyslipidemia and                 Sleep Apnea.  Sonographer:    Doyal Point MHA, BS, RDCS Referring Phys: [id number] EVALENE JINNY LUNGER  Sonographer Comments: Patient is obese and suboptimal parasternal window. Image acquisition challenging due to patient body habitus. IMPRESSIONS  1. Left ventricular ejection fraction, by estimation, is 20 to 25%. The left ventricle has severely decreased function. The left ventricle demonstrates global hypokinesis. There is mild left ventricular hypertrophy. Left ventricular diastolic parameters  are consistent with Grade II diastolic dysfunction (pseudonormalization).  2. Right ventricular systolic function is normal. The right ventricular size  is normal.  3. Left atrial size was severely dilated.  4. The mitral valve is grossly normal. Mild mitral valve regurgitation.  5. The aortic valve is calcified. Aortic valve regurgitation is not visualized.  6. Aortic dilatation noted. There is mild dilatation of the aortic root, measuring 41 mm.  7. The inferior vena cava is dilated in size with <50% respiratory variability, suggesting right atrial pressure of 15 mmHg. FINDINGS  Left Ventricle: Left ventricular ejection fraction, by estimation, is 20 to 25%. The left ventricle has severely decreased function. The left ventricle demonstrates global hypokinesis. The left ventricular internal cavity size was normal in size. There is mild left ventricular hypertrophy. Left ventricular diastolic parameters are consistent with Grade II diastolic dysfunction (pseudonormalization). Right Ventricle: The right ventricular size is normal. No increase in right ventricular wall thickness. Right ventricular systolic function is normal. Left Atrium: Left atrial size was severely dilated. Right Atrium: Right atrial size was normal in size. Pericardium: There is no evidence of pericardial effusion. Mitral Valve: The mitral valve is grossly normal. Mild to moderate mitral annular calcification. Mild mitral valve regurgitation. Tricuspid Valve: The tricuspid valve is normal in structure. Tricuspid valve regurgitation is mild. Aortic Valve: The aortic valve is calcified. Aortic valve regurgitation is not visualized. Aortic valve mean gradient measures 8.5 mmHg. Aortic valve peak gradient measures 14.5 mmHg. Aortic valve area, by VTI measures 2.37 cm. Pulmonic Valve: The pulmonic valve was not well visualized. Pulmonic valve regurgitation is not visualized. Aorta: Aortic dilatation noted. There is mild dilatation of the aortic root, measuring  41 mm. Venous: The inferior vena cava is dilated in size with less than 50% respiratory variability, suggesting right atrial pressure of 15  mmHg. IAS/Shunts: No atrial level shunt detected by color flow Doppler.  LEFT VENTRICLE PLAX 2D LVIDd:         6.81 cm   Diastology LVIDs:         6.14 cm   LV e' medial:    4.90 cm/s LV PW:         1.33 cm   LV E/e' medial:  18.8 LV IVS:        1.36 cm   LV e' lateral:   9.36 cm/s LVOT diam:     2.60 cm   LV E/e' lateral: 9.9 LV SV:         97 LV SV Index:   40 LVOT Area:     5.31 cm  RIGHT VENTRICLE             IVC RV Basal diam:  4.29 cm     IVC diam: 3.36 cm RV Mid diam:    2.98 cm RV S prime:     14.70 cm/s TAPSE (M-mode): 2.6 cm LEFT ATRIUM            Index        RIGHT ATRIUM           Index LA diam:      5.70 cm  2.34 cm/m   RA Area:     19.40 cm LA Vol (A2C): 88.1 ml  36.20 ml/m  RA Volume:   58.10 ml  23.87 ml/m LA Vol (A4C): 153.0 ml 62.87 ml/m  AORTIC VALVE AV Area (Vmax):    2.80 cm AV Area (Vmean):   2.51 cm AV Area (VTI):     2.37 cm AV Vmax:           190.50 cm/s AV Vmean:          134.500 cm/s AV VTI:            0.410 m AV Peak Grad:      14.5 mmHg AV Mean Grad:      8.5 mmHg LVOT Vmax:         100.40 cm/s LVOT Vmean:        63.700 cm/s LVOT VTI:          0.183 m LVOT/AV VTI ratio: 0.45  AORTA Ao Sinus diam: 4.06 cm MITRAL VALVE MV Area (PHT): 3.77 cm    SHUNTS MV Decel Time: 201 msec    Systemic VTI:  0.18 m MV E velocity: 92.20 cm/s  Systemic Diam: 2.60 cm MV A velocity: 88.00 cm/s MV E/A ratio:  1.05 Redell Cave MD Electronically signed by Redell Cave MD Signature Date/Time: 09/30/2024/4:36:50 PM    Final     Administration History     None           No data to display          No results found for: NITRICOXIDE      Assessment & Plan:   OSA on CPAP Severe OSA on CPAP. Excellent compliance and control. Receives benefit from use. Residual daytime fatigue associated with underlying HF. Aware of risks of untreated OSA. Encouraged to continue utilizing nightly. Understands proper care/use of device. Healthy weight management encouraged. Safe driving  practices reviewed.   Patient Instructions  Continue to use CPAP every night, minimum of 4-6 hours a night.  Change equipment as directed.  Wash your tubing with warm soap and water daily, hang to dry. Wash humidifier portion weekly. Use bottled, distilled water and change daily Be aware of reduced alertness and do not drive or operate heavy machinery if experiencing this or drowsiness.  Exercise encouraged, as tolerated. Healthy weight management discussed.  Avoid or decrease alcohol consumption and medications that make you more sleepy, if possible. Notify if persistent daytime sleepiness occurs even with consistent use of PAP therapy.  Change CPAP supplies... Every month Mask cushions and/or nasal pillows CPAP machine filters Every 3 months Mask frame (not including the headgear) CPAP tubing Every 6 months Mask headgear Chin strap (if applicable) Humidifier water tub  Follow up in 1 year with Katie Daanish Copes,NP, or sooner, if needed    HFrEF (heart failure with reduced ejection fraction) (HCC) Euvolemic on exam. Aware of red flag symptoms. Awaiting appt with HF clinic.    Advised if symptoms do not improve or worsen, to please contact office for sooner follow up or seek emergency care.   I spent 25 minutes of dedicated to the care of this patient on the date of this encounter to include pre-visit review of records, face-to-face time with the patient discussing conditions above, post visit ordering of testing, clinical documentation with the electronic health record, making appropriate referrals as documented, and communicating necessary findings to members of the patients care team.  Comer LULLA Rouleau, NP 10/06/2024  Pt aware and understands NP's role.

## 2024-11-02 ENCOUNTER — Other Ambulatory Visit: Payer: Self-pay | Admitting: Cardiovascular Disease

## 2024-11-05 ENCOUNTER — Other Ambulatory Visit: Payer: Self-pay | Admitting: Cardiovascular Disease

## 2024-11-05 MED ORDER — SACUBITRIL-VALSARTAN 49-51 MG PO TABS: 1.0000 | ORAL_TABLET | Freq: Two times a day (BID) | ORAL | 0 refills | Status: DC

## 2024-11-05 NOTE — Telephone Encounter (Signed)
 Entresto  refill sent in.  Will forward to Dr. Gollan for the Viagra  refill.

## 2024-11-05 NOTE — Telephone Encounter (Signed)
*  STAT* If patient is at the pharmacy, call can be transferred to refill team.   1. Which medications need to be refilled? (please list name of each medication and dose if known)   sacubitril-valsartan (ENTRESTO ) 49-51 MG Take 1 tablet by mouth 2 (two) times daily.    sildenafil  (VIAGRA ) 100 MG tablet TAKE 1 TABLET BY MOUTH AS NEEDED AS DIRECTED       4. Which pharmacy/location (including street and city if local pharmacy) is medication to be sent to?  [facility] [phone] - [place], [place] - [address]     5. Do they need a 30 day or 90 day supply? 90

## 2024-11-09 ENCOUNTER — Telehealth: Payer: Self-pay | Admitting: Cardiology

## 2024-11-09 ENCOUNTER — Other Ambulatory Visit: Payer: Self-pay | Admitting: Cardiovascular Disease

## 2024-11-09 DIAGNOSIS — I10 Essential (primary) hypertension: Secondary | ICD-10-CM

## 2024-11-09 NOTE — Telephone Encounter (Signed)
 Called to confirm/remind patient of their appointment at the [facility] on 11/10/24.   Appointment:   [x] Confirmed  [] Left mess   [] No answer/No voice mail  [] VM Full/unable to leave message  [] Phone not in service  Patient reminded to bring all medications and/or complete list.  Confirmed patient has transportation. Gave directions, instructed to utilize valet parking.

## 2024-11-10 ENCOUNTER — Encounter: Payer: Self-pay | Admitting: Cardiology

## 2024-11-10 ENCOUNTER — Ambulatory Visit: Attending: Cardiology | Admitting: Cardiology

## 2024-11-10 ENCOUNTER — Other Ambulatory Visit: Payer: Self-pay | Admitting: *Deleted

## 2024-11-10 VITALS — BP 136/88 | HR 62 | Wt 269.0 lb

## 2024-11-10 DIAGNOSIS — E785 Hyperlipidemia, unspecified: Secondary | ICD-10-CM | POA: Insufficient documentation

## 2024-11-10 DIAGNOSIS — F1090 Alcohol use, unspecified, uncomplicated: Secondary | ICD-10-CM | POA: Insufficient documentation

## 2024-11-10 DIAGNOSIS — I110 Hypertensive heart disease with heart failure: Secondary | ICD-10-CM | POA: Insufficient documentation

## 2024-11-10 DIAGNOSIS — I447 Left bundle-branch block, unspecified: Secondary | ICD-10-CM | POA: Insufficient documentation

## 2024-11-10 DIAGNOSIS — Z79899 Other long term (current) drug therapy: Secondary | ICD-10-CM | POA: Diagnosis not present

## 2024-11-10 DIAGNOSIS — G4733 Obstructive sleep apnea (adult) (pediatric): Secondary | ICD-10-CM | POA: Diagnosis not present

## 2024-11-10 DIAGNOSIS — I491 Atrial premature depolarization: Secondary | ICD-10-CM | POA: Insufficient documentation

## 2024-11-10 DIAGNOSIS — I428 Other cardiomyopathies: Secondary | ICD-10-CM

## 2024-11-10 DIAGNOSIS — I420 Dilated cardiomyopathy: Secondary | ICD-10-CM

## 2024-11-10 DIAGNOSIS — I5022 Chronic systolic (congestive) heart failure: Secondary | ICD-10-CM | POA: Insufficient documentation

## 2024-11-10 DIAGNOSIS — Z8249 Family history of ischemic heart disease and other diseases of the circulatory system: Secondary | ICD-10-CM | POA: Insufficient documentation

## 2024-11-10 MED ORDER — SACUBITRIL-VALSARTAN 97-103 MG PO TABS: 1.0000 | ORAL_TABLET | Freq: Two times a day (BID) | ORAL | 6 refills | Status: DC

## 2024-11-10 NOTE — Patient Instructions (Addendum)
 Medication Changes:  INCREASE Entresto  to 97/103 mg Twice daily   Lab Work:  Labs are needed today, PLEASE go downstairs to [place] on LOWER LEVEL to have your blood work completed.  Labs done today, your results will be available in MyChart, we will contact you for abnormal readings.   Your provider would like for you to return in 1-2 weeks to have labs drawn. You can go to any [facility] location for these labs, you do not need an appointment. Most locations are open from 8:00 am to 4:30 pm and may close for lunch from 12-1 pm daily  [place] - [address] [facility]) - [phone] ([facility]) - [phone]   [employer]  - [address] Manufacturing Engineer [facility]) - [phone] [address] [employer]    [place] - [address] [place] (inside [facility]) - [address] - [address]   [place]  - [address] (Medical Arts Building-Lower Level at [facility]) [address] - [address] Dietitian Building at [facility]) - [address] - [address] Chief Technology Officer)   Testing/Procedures:  Your physician has requested that you have an echocardiogram. Echocardiography is a painless test that uses sound waves to create images of your heart. It provides your doctor with information about the size and shape of your heart and how well your heart's chambers and valves are working. This procedure takes approximately one hour. There are no restrictions for this procedure. Please do NOT wear cologne, perfume, aftershave, or lotions (deodorant is allowed). Please arrive 15 minutes prior to your appointment time. IN MARCH 2026, YOU WILL BE CALLED TO SCHEDULE THIS  Please note: We ask at that you not bring children with you during ultrasound (echo/ vascular) testing. Due to room size and safety concerns, children are not allowed in the  ultrasound rooms during exams. Our front office staff cannot provide observation of children in our lobby area while testing is being conducted. An adult accompanying a patient to their appointment will only be allowed in the ultrasound room at the discretion of the ultrasound technician under special circumstances. We apologize for any inconvenience.   Cardiac MRI has been ordered, you will be called to schedule this, please see instructions below  Special Instructions // Education:  Do the following things EVERYDAY: Weigh yourself in the morning before breakfast. Write it down and keep it in a log. Take your medicines as prescribed Eat low salt foods--Limit salt (sodium) to 2000 mg per day.  Stay as active as you can everyday Limit all fluids for the day to less than 2 liters    You are scheduled for Cardiac MRI at the location below.  Please arrive for your appointment at ______________ . ?  [facility] [address] [place], [place] [address] Please go to the [facility] and check-in with the desk attendant.   Magnetic resonance imaging (MRI) is a painless test that produces images of the inside of the body without using Xrays.  During an MRI, strong magnets and radio waves work together in a data processing manager to form detailed images.   MRI images may provide more details about a medical condition than X-rays, CT scans, and ultrasounds can provide.  You may be given earphones to listen for instructions.  You  may eat a light breakfast and take medications as ordered with the exception of furosemide, hydrochlorothiazide, chlorthalidone or spironolactone  (or any other fluid pill). If you are undergoing a stress MRI, please avoid stimulants for 12 hr prior to test. (I.e. Caffeine, nicotine, chocolate, or antihistamine medications)  If your provider has ordered anti-anxiety medications for this test, then you will need a driver.  An IV will be inserted  into one of your veins. Contrast material will be injected into your IV. It will leave your body through your urine within a day. You may be told to drink plenty of fluids to help flush the contrast material out of your system.  You will be asked to remove all metal, including: Watch, jewelry, and other metal objects including hearing aids, hair pieces and dentures. Also wearable glucose monitoring systems (ie. Freestyle Libre and Omnipods) (Braces and fillings normally are not a problem.)   TEST WILL TAKE APPROXIMATELY 1 HOUR  PLEASE NOTIFY SCHEDULING AT LEAST 24 HOURS IN ADVANCE IF YOU ARE UNABLE TO KEEP YOUR APPOINTMENT. [phone]  For more information and frequently asked questions, please visit our website : [email or web address]  Please call the Cardiac Imaging Nurse Navigators with any questions/concerns. [phone] Office    Follow-Up in:  Please follow up with our heart failure pharmacist in 3 weeks  Your physician recommends that you schedule a follow-up appointment in: 2-3 months with Dr Rolan (March 2026), **PLEASE CALL OUR OFFICE IN [place] TO SCHEDULE THIS APPOINTMENT    If you have any questions or concerns before your next appointment please send us  a message through [place] or call our office at [phone], If it is after office hours your call will be answered by our answering service and directed appropriately.     At the [facility], you and your health needs are our priority. We have a designated team specialized in the treatment of Heart Failure. This Care Team includes your primary Heart Failure Specialized Cardiologist (physician), Advanced Practice Providers (APPs- Physician Assistants and Nurse Practitioners), and Pharmacist who all work together to provide you with the care you need, when you need it.   You may see any of the following providers on your designated Care Team at your next follow up:  Dr. Toribio Fuel Dr. Ezra Rolan Dr. Ria Commander Dr. Odis Brownie Greig Mosses, NP Caffie Shed, [place] [address] [place], [place] Beckey Coe, NP Jordan Lee, NP Ellouise Class, NP Jaun Bash, PharmD

## 2024-11-10 NOTE — Progress Notes (Signed)
 PCP: Nichole Senior, MD Cardiology: Dr. Perla HF Cardiology: Dr. Rolan  Chief complaint: CHF  68 y.o. with history of chronic systolic CHF/nonischemic cardiomyopathy, chronic LBBB, OSA, HTN was referred by Dr. Gollan for evaluation of CHF.  Patient has a cardiomyopathy known from as far back as 2014.  Echo in 6/14 showed EF 35-40%.  LHC done in 7/14 showed minimal luminal irregularities.  There was some concern that heavy ETOH use was contributing to cardiomyopathy. Echo in 6/24 showed EF 25-30%, and echo in 10/25 showed EF 20-25% with normal RV.  He has a wide LBBB chronically.  He drank heavily until about a month ago, has now cut back to about 2-3 glasses wine/day.  Nonsmoker.  No strong family history of cardiomyopathy, grandfather had MI at 23.    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  He gets mildly dyspneic walking up stairs.  Generally no problems with ADLs or walking on flat ground.  No chest pain.  No lightheadedness or palpitations.  No orthopnea/PND.  He goes to the gym sporadically.   ECG (personally reviewed): NSR, PACs, LBBB 156 msec  Labs (7/25): LDL 101, K 4.3, creatinine 0.72  PMH: 1. LBBB 2. OSA: Uses CPAP 3. Hyperlipidemia 4. ETOH abuse 5. Hyperlipidemia: Statin myalgias (atorvastatin , daily rosuvastatin).  6. HTN 7. Chronic systolic CHF: Nonischemic cardiomyopathy.  - Echo (6/14): EF 35-40% - LHC (7/14): Luminal irregularities - Echo (6/24): EF 25-30% - Echo (10/25): EF 20-25%, normal RV, mild MR, IVC dilated  SH: Married, lives in [place], owns landscaping company, played football at [employer].   FH: Grandfather with MI at 85.  No SCD or cardiomyopathy.   ROS: All systems reviewed and negative except as per HPI.   Current Outpatient Medications  Medication Sig Dispense Refill   carvedilol  (COREG ) 12.5 MG tablet Take 1 tablet (12.5 mg total) by mouth 2 (two) times daily with a meal. 180 tablet 3    cyanocobalamin  100 MCG tablet Take 100 mcg by mouth daily.     dapagliflozin  propanediol (FARXIGA ) 10 MG TABS tablet Take 1 tablet (10 mg total) by mouth daily before breakfast. 90 tablet 3   dapagliflozin  propanediol (FARXIGA ) 10 MG TABS tablet Take 1 tablet (10 mg total) by mouth daily before breakfast. 14 tablet    doxazosin  (CARDURA ) 8 MG tablet TAKE 1 TABLET(8 MG) BY MOUTH DAILY 90 tablet 1   Multiple Vitamins-Minerals (ZINC PO) Take by mouth.     rosuvastatin (CRESTOR) 5 MG tablet Take 5 mg by mouth once a week.     sacubitril -valsartan  (ENTRESTO ) 97-103 MG Take 1 tablet by mouth 2 (two) times daily. 60 tablet 6   sildenafil  (VIAGRA ) 100 MG tablet TAKE 1 TABLET BY MOUTH AS NEEDED AS DIRECTED 10 tablet 0   spironolactone  (ALDACTONE ) 25 MG tablet Take 1 tablet (25 mg total) by mouth daily. 90 tablet 3   No current facility-administered medications for this visit.   BP 136/88   Pulse 62   Wt 269 lb (122 kg)   SpO2 97%   BMI 35.49 kg/m  General: NAD, overweight.  Neck: Thick. No JVD, no thyromegaly or thyroid nodule.  Lungs: Clear to auscultation bilaterally with normal respiratory effort. CV: Nondisplaced PMI.  Heart regular S1/S2, no S3/S4, no murmur. Trace ankle edema.  No carotid bruit.  Normal pedal pulses.  Abdomen: Soft, nontender, no hepatosplenomegaly, no distention.  Skin: Intact without lesions or rashes.  Neurologic: Alert and oriented x 3.  Psych: Normal affect. Extremities: No clubbing or cyanosis.  HEENT: Normal.   Assessment/Plan: 1. Chronic systolic CHF: Suspect nonischemic cardiomyopathy.  Echo in 6/14 with EF 25-30%, LHC in 7/14 with minimal luminal irregularlities.  Echo in 10/25 showed EF 20-25%, normal RV.  No family history of cardiomyopathy.  He has a chronic wide LBBB, possible LBBB cardiomyopathy.  He drank heavily in the past, this also could have contributed.  He has cut back but still has 2-3 drinks/day. NYHA class II, not volume overloaded on exam.  - I  will arrange for cardiac MRI to assess for infiltrative disease, myocarditis.  - Increase Entresto  to 97/103 bid with BMET/BNP today and BMET in 10 days.  - Continue Coreg  12.5 bid - Continue Farxiga  10 daily - Continue spironolactone  25 mg daily.  - We discussed complete ETOH cessation.  He is will to do this.  - Repeat echo in 3/26 when on maximal tolerated GDMT and off ETOH.  If EF remains < 35%, he will need CRT-D device (wide LBBB).  2. HTN: Increasing Entresto  as above.  3. Hyperlipidemia: Myalgias with statin.  Continue once weekly Crestor.  4. OSA: Continue CPAP.  5. Heavy ETOH: He is going to try to stop drinking completely, has cut back significantly.  Concern that this may be contributing to his cardiomyopathy.   Followup 3 wks with HF pharmacist for med titration (increase Coreg ), see me in 2 months.   I spent 56 minutes reviewing records, interviewing/examining patient, and managing orders.   Casey Hess 11/10/2024 3:30 PM

## 2024-11-12 LAB — CBC
Hematocrit: 44.2 % (ref 37.5–51.0)
Hemoglobin: 14.4 g/dL (ref 13.0–17.7)
MCH: 30.8 pg (ref 26.6–33.0)
MCHC: 32.6 g/dL (ref 31.5–35.7)
MCV: 95 fL (ref 79–97)
Platelets: 179 x10E3/uL (ref 150–450)
RBC: 4.67 x10E6/uL (ref 4.14–5.80)
RDW: 13.2 % (ref 11.6–15.4)
WBC: 6.2 x10E3/uL (ref 3.4–10.8)

## 2024-11-12 LAB — BASIC METABOLIC PANEL WITH GFR
BUN/Creatinine Ratio: 21 (ref 10–24)
BUN: 18 mg/dL (ref 8–27)
CO2: 24 mmol/L (ref 20–29)
Calcium: 8.9 mg/dL (ref 8.6–10.2)
Chloride: 103 mmol/L (ref 96–106)
Creatinine, Ser: 0.85 mg/dL (ref 0.76–1.27)
Glucose: 97 mg/dL (ref 70–99)
Potassium: 4.8 mmol/L (ref 3.5–5.2)
Sodium: 143 mmol/L (ref 134–144)
eGFR: 94 mL/min/1.73 (ref >59)

## 2024-11-12 LAB — BRAIN NATRIURETIC PEPTIDE: BNP: 305.2 pg/mL — ABNORMAL HIGH (ref 0.0–100.0)

## 2024-11-15 ENCOUNTER — Ambulatory Visit (HOSPITAL_COMMUNITY): Payer: Self-pay | Admitting: Cardiology

## 2024-11-25 ENCOUNTER — Other Ambulatory Visit: Payer: Self-pay | Admitting: Cardiovascular Disease

## 2024-12-02 ENCOUNTER — Ambulatory Visit

## 2024-12-04 NOTE — Progress Notes (Deleted)
 " Advanced [facility] Failure Clinic Note  PCP: Nichole Senior, MD PCP-Cardiologist: Evalene Lunger, MD HF-Cardiologist: Ezra Shuck, MD  HPI:  Mr. Perkins is a 68 y.o. with history of chronic systolic CHF/nonischemic cardiomyopathy, chronic LBBB, OSA, HTN was referred to CHF clinic by Dr. Gollan.  Patient has a cardiomyopathy known from as far back as 2014.  Echo in 05/2013 showed EF 35-40%.  LHC done in 06/2013 showed minimal luminal irregularities.  There was some concern that heavy ETOH use was contributing to cardiomyopathy. Echo in 05/2023 showed EF 25-30%, and echo in 09/2024 showed EF 20-25% with normal RV.  He has a wide LBBB chronically.  He drank heavily until 09/2024, has now cut back to about 2-3 glasses wine/day.  Nonsmoker.  No strong family history of cardiomyopathy, grandfather had MI at 68.    Today Casey R Lisle Jr. returns to Heart Failure Clinic for pharmacist medication titration. Reports feeling ***. {Reports/Denies:210917258} {ACTIONS;DENIES/REPORTS:21021675::Denies} being able to complete all activities of daily living (ADLs). Is *** active throughout the day. Weight at home is *** pounds. Takes {CHL AMB AHFC Medications:210917260} ***. Appetite ***. {Does Follow/Does Not Follow:210917261} a low sodium diet.  Current Heart Failure Medications: Loop diuretic: none Beta-Blocker: carvedilol  12.5 mg BID ACEI/ARB/ARNI: Entresto  97/103 mg BID MRA: spironolactone  25 mg daily SGLT2i: Farxiga  10 mg daily  Has the patient been experiencing any side effects to the medications prescribed? {yes/no:20286}  Does the patient have any problems obtaining medications due to transportation or finances? {yes/no:20286}  Understanding of regimen: {CHL AMB AHFC Excellent/Good/Fair/Poor:210917262}  Understanding of indications: {CHL AMB AHFC Excellent/Good/Fair/Poor:210917262}  Potential of adherence: {CHL AMB AHFC Excellent/Good/Fair/Poor:210917262}  Patient understands to avoid  NSAIDs.  Patient understands to avoid decongestants.  Pertinent Lab Values: Creatinine, Ser  Date Value Ref Range Status  11/10/2024 0.85 0.76 - 1.27 mg/dL Final   BUN  Date Value Ref Range Status  11/10/2024 18 8 - 27 mg/dL Final   Potassium  Date Value Ref Range Status  11/10/2024 4.8 3.5 - 5.2 mmol/L Final   Sodium  Date Value Ref Range Status  11/10/2024 143 134 - 144 mmol/L Final   BNP  Date Value Ref Range Status  11/10/2024 305.2 (H) 0.0 - 100.0 pg/mL Final    Comment:    [employer] ADVIA Centaur XP methodology   TSH  Date Value Ref Range Status  07/07/2013 2.010 0.450 - 4.500 uIU/mL Final    Vital Signs: There were no vitals filed for this visit.  Assessment/Plan: 1. Chronic systolic CHF: Suspect nonischemic cardiomyopathy.  Echo in 05/2013 with EF 25-30%, LHC in 06/2013 with minimal luminal irregularlities.  Echo in 09/2024 showed EF 20-25%, normal RV.  No family history of cardiomyopathy.  He has a chronic wide LBBB, possible LBBB cardiomyopathy.  He drank heavily in the past, this also could have contributed.  He has cut back but still has 2-3 drinks/day. NYHA class II, not volume overloaded on exam.  - MD to arrange for cardiac MRI to assess for infiltrative disease, myocarditis.  - Continue  Entresto  to 97/103 bid with BMET/BNP today and BMET in 10 days.  - Continue Coreg  12.5 bid - Continue Farxiga  10 daily - Continue spironolactone  25 mg daily.  - We discussed complete ETOH cessation.  He is will to do this.  - Repeat echo in 02/2025 when on maximal tolerated GDMT and off ETOH.  If EF remains < 35%, he will need CRT-D device (wide LBBB).  2. HTN: Increasing Entresto  as above.  3.  Hyperlipidemia: Myalgias with statin.  Continue once weekly Crestor.  4. OSA: Continue CPAP.  5. Heavy ETOH: He is going to try to stop drinking completely, has cut back significantly.  Concern that this may be contributing to his cardiomyopathy.   Follow up: ***  *** "

## 2024-12-07 ENCOUNTER — Telehealth: Payer: Self-pay | Admitting: Cardiology

## 2024-12-07 NOTE — Telephone Encounter (Signed)
 Called to confirm/remind patient of their appointment at the [facility] on 12/08/24.   Appointment:   [x] Confirmed  [] Left mess   [] No answer/No voice mail  [] VM Full/unable to leave message  [] Phone not in service  Patient reminded to bring all medications and/or complete list.  Confirmed patient has transportation. Gave directions, instructed to utilize valet parking.

## 2024-12-08 ENCOUNTER — Ambulatory Visit

## 2024-12-18 ENCOUNTER — Telehealth: Payer: Self-pay | Admitting: Pharmacist

## 2024-12-18 NOTE — Telephone Encounter (Signed)
 Called to confirm/remind patient of their appointment at the [facility] on 12/21/24.   Appointment:   [x] Confirmed  [] Left mess   [] No answer/No voice mail  [] VM Full/unable to leave message  [] Phone not in service  Patient reminded to bring all medications and/or complete list.  Confirmed patient has transportation. Gave directions, instructed to utilize valet parking.

## 2024-12-21 ENCOUNTER — Ambulatory Visit (HOSPITAL_BASED_OUTPATIENT_CLINIC_OR_DEPARTMENT_OTHER): Admitting: Pharmacist

## 2024-12-21 ENCOUNTER — Other Ambulatory Visit: Payer: Self-pay | Admitting: Cardiovascular Disease

## 2024-12-21 ENCOUNTER — Other Ambulatory Visit (HOSPITAL_COMMUNITY): Payer: Self-pay

## 2024-12-21 ENCOUNTER — Encounter (HOSPITAL_COMMUNITY): Payer: Self-pay

## 2024-12-21 ENCOUNTER — Other Ambulatory Visit: Payer: Self-pay

## 2024-12-21 ENCOUNTER — Other Ambulatory Visit
Admission: RE | Admit: 2024-12-21 | Discharge: 2024-12-21 | Disposition: A | Discharge status: Home / Self Care | Source: Ambulatory Visit | Attending: Family | Admitting: Family

## 2024-12-21 ENCOUNTER — Encounter (HOSPITAL_COMMUNITY): Payer: Self-pay | Admitting: Pharmacist

## 2024-12-21 ENCOUNTER — Ambulatory Visit: Payer: Self-pay | Admitting: Pharmacist

## 2024-12-21 DIAGNOSIS — I10 Essential (primary) hypertension: Secondary | ICD-10-CM

## 2024-12-21 DIAGNOSIS — I428 Other cardiomyopathies: Secondary | ICD-10-CM | POA: Insufficient documentation

## 2024-12-21 LAB — BASIC METABOLIC PANEL WITH GFR
Anion gap: 10 (ref 5–15)
BUN: 18 mg/dL (ref 8–23)
CO2: 26 mmol/L (ref 22–32)
Calcium: 9.1 mg/dL (ref 8.9–10.3)
Chloride: 105 mmol/L (ref 98–111)
Creatinine, Ser: 0.7 mg/dL (ref 0.61–1.24)
GFR, Estimated: >60 mL/min
Glucose, Bld: 104 mg/dL — ABNORMAL HIGH (ref 70–99)
Potassium: 4.7 mmol/L (ref 3.5–5.1)
Sodium: 141 mmol/L (ref 135–145)

## 2024-12-21 LAB — PRO BRAIN NATRIURETIC PEPTIDE: Pro Brain Natriuretic Peptide: 461 pg/mL — ABNORMAL HIGH

## 2024-12-21 MED ORDER — SPIRONOLACTONE 25 MG PO TABS
25.0000 mg | ORAL_TABLET | Freq: Every day | ORAL | 3 refills | Status: AC
  Filled 2024-12-21 – 2024-12-30 (×3): qty 90, 90d supply, fill #0

## 2024-12-21 MED ORDER — CARVEDILOL 25 MG PO TABS
25.0000 mg | ORAL_TABLET | Freq: Two times a day (BID) | ORAL | 11 refills | Status: AC
  Filled 2024-12-21 – 2024-12-30 (×3): qty 60, 30d supply, fill #0

## 2024-12-21 MED ORDER — ROSUVASTATIN CALCIUM 5 MG PO TABS
5.0000 mg | ORAL_TABLET | ORAL | 11 refills | Status: AC
  Filled 2024-12-21 – 2024-12-30 (×3): qty 4, 28d supply, fill #0

## 2024-12-21 MED ORDER — SACUBITRIL-VALSARTAN 97-103 MG PO TABS
1.0000 | ORAL_TABLET | Freq: Two times a day (BID) | ORAL | 6 refills | Status: AC
  Filled 2024-12-21 – 2024-12-30 (×3): qty 60, 30d supply, fill #0

## 2024-12-21 MED ORDER — SILDENAFIL CITRATE 100 MG PO TABS
100.0000 mg | ORAL_TABLET | ORAL | 0 refills | Status: DC | PRN
  Filled 2024-12-21: qty 10, 10d supply, fill #0

## 2024-12-21 MED ORDER — DAPAGLIFLOZIN PROPANEDIOL 10 MG PO TABS
10.0000 mg | ORAL_TABLET | Freq: Every day | ORAL | 11 refills | Status: AC
  Filled 2024-12-21 – 2024-12-30 (×3): qty 30, 30d supply, fill #0

## 2024-12-21 MED ORDER — DOXAZOSIN MESYLATE 8 MG PO TABS
8.0000 mg | ORAL_TABLET | Freq: Every day | ORAL | 2 refills | Status: AC
  Filled 2024-12-21 – 2025-01-09 (×4): qty 90, 90d supply, fill #0

## 2024-12-21 NOTE — Patient Instructions (Signed)
 It was a pleasure seeing you today!  MEDICATIONS: -Increase carvedilol ; to 25 mg twice daily. You can take two of your 12.5 mg tablets twice daily until you run out. -Call if you have questions about your medications.  LABS: -We will call you if your labs need attention.  NEXT APPOINTMENT: Return to clinic 2 weeks to see Leiani Enright. Call at the end of February to schedule follow-up with Dr. Rolan  In general, to take care of your heart failure: -Limit your fluid intake to 2 Liters (half-gallon) per day.   -Limit your salt intake to ideally 2-3 grams (2000-3000 mg) per day. -Weigh yourself daily and record, and bring that weight diary to your next appointment.  (Weight gain of 2-3 pounds in 1 day typically means fluid weight.) -The medications for your heart are to help your heart and help you live longer.   -Please contact us  before stopping any of your heart medications.  Call the clinic at [phone] with questions or to reschedule future appointments.

## 2024-12-21 NOTE — Progress Notes (Signed)
 " Advanced [facility] Failure Clinic Note  PCP: Nichole Senior, MD PCP-Cardiologist: Evalene Lunger, MD HF-Cardiologist: Ezra Shuck, MD  HPI:  Mr. Casey Hess is a 69 y.o. with history of chronic systolic CHF/nonischemic cardiomyopathy, chronic LBBB, OSA, HTN was referred to CHF clinic by Dr. Gollan.  Patient has a cardiomyopathy known from as far back as 2014.  Echo in 05/2013 showed EF 35-40%.  LHC done in 06/2013 showed minimal luminal irregularities.  There was some concern that heavy ETOH use was contributing to cardiomyopathy. Echo in 05/2023 showed EF 25-30%, and echo in 09/2024 showed EF 20-25% with normal RV.  He has a wide LBBB chronically.  He drank heavily until 09/2024, has now cut back to about 2-3 glasses wine/day.  Nonsmoker.  No strong family history of cardiomyopathy, grandfather had MI at 72.    Today Casey R Sandt Jr. returns to Heart Failure Clinic for pharmacist medication titration. Reports feeling good. Reports shortness of breath is at baseline and patient has occasional LEE, though none present today. Denies dizziness, lightheadedness, fatigue, chest pain, palpitations, orthopnea, orthostasis, PND. Reports being able to complete all activities of daily living (ADLs). Is pretty active throughout the day. Weight at home is 260 pounds. Takes no diuretic . Appetite good. Somewhat follows a low sodium diet. Patient not check home BP.  Current Heart Failure Medications: Loop diuretic: none Beta-Blocker: carvedilol  12.5 mg BID ACEI/ARB/ARNI: Entresto  97/103 mg BID MRA: spironolactone  25 mg daily SGLT2i: Farxiga  10 mg daily  Has the patient been experiencing any side effects to the medications prescribed? No  Does the patient have any problems obtaining medications due to transportation or finances? No  Understanding of regimen: Excellent  Understanding of indications: Excellent  Potential of adherence: Excellent  Patient understands to avoid NSAIDs.  Patient understands to  avoid decongestants.  Pertinent Lab Values: Creatinine, Ser  Date Value Ref Range Status  11/10/2024 0.85 0.76 - 1.27 mg/dL Final   BUN  Date Value Ref Range Status  11/10/2024 18 8 - 27 mg/dL Final   Potassium  Date Value Ref Range Status  11/10/2024 4.8 3.5 - 5.2 mmol/L Final   Sodium  Date Value Ref Range Status  11/10/2024 143 134 - 144 mmol/L Final   BNP  Date Value Ref Range Status  11/10/2024 305.2 (H) 0.0 - 100.0 pg/mL Final    Comment:    [employer] ADVIA Centaur XP methodology   TSH  Date Value Ref Range Status  07/07/2013 2.010 0.450 - 4.500 uIU/mL Final    Vital Signs: Today's Vitals   12/21/24 1032  BP: 136/88  Pulse: 70  SpO2: 97%    Assessment/Plan: 1. Chronic systolic CHF: Suspect nonischemic cardiomyopathy.  Echo in 05/2013 with EF 25-30%, LHC in 06/2013 with minimal luminal irregularlities.  Echo in 09/2024 showed EF 20-25%, normal RV.  No family history of cardiomyopathy.  He has a chronic wide LBBB, possible LBBB cardiomyopathy.  He drank heavily in the past, this also could have contributed.  He has stopped drinking after New Year's. NYHA class II symptoms, not volume overloaded on exam. Weight up slightly in winter clothes. - MD scheduled cardiac MRI to assess for infiltrative disease, myocarditis.  - Continue  Entresto  to 97/103 bid with BMET/BNP today. - Increase carvedilol  to 25 bid - Continue Farxiga  10 daily - Continue spironolactone  25 mg daily.   - Congratulated patient for alcohol cessation over the past several days. - Repeat echo per MD in 02/2025 when on maximal tolerated GDMT and off  ETOH.  If EF remains < 35%, he will need CRT-D device (wide LBBB).  2. HTN: Increasing Entresto  as above.  3. Hyperlipidemia: Myalgias with statin.  Continue once weekly Crestor .  4. OSA: Continue CPAP.  5. Heavy ETOH: Her reports alcohol cessation after New Year's. Concern that this may be contributing to his cardiomyopathy.   Follow up: pharmacist in 2  weeks and Dr. Rolan in March  Please do not hesitate to reach out with questions or concerns,  Jaun Bash, PharmD, CPP, BCPS, [facility] Heart Failure Pharmacist  Phone - [phone] 12/21/2024 11:08 AM  "

## 2024-12-23 ENCOUNTER — Other Ambulatory Visit: Payer: Self-pay | Admitting: Cardiology

## 2024-12-23 ENCOUNTER — Ambulatory Visit
Admission: RE | Admit: 2024-12-23 | Discharge: 2024-12-23 | Disposition: A | Discharge status: Home / Self Care | Source: Ambulatory Visit | Attending: Cardiology | Admitting: Cardiology

## 2024-12-23 ENCOUNTER — Other Ambulatory Visit: Payer: Self-pay

## 2024-12-23 DIAGNOSIS — I428 Other cardiomyopathies: Secondary | ICD-10-CM | POA: Insufficient documentation

## 2024-12-23 MED ORDER — GADOBUTROL 1 MMOL/ML IV SOLN
15.0000 mL | Freq: Once | INTRAVENOUS | Status: AC | PRN
  Administered 2024-12-23: 15 mL via INTRAVENOUS

## 2024-12-24 ENCOUNTER — Other Ambulatory Visit: Payer: Self-pay

## 2024-12-24 ENCOUNTER — Other Ambulatory Visit (HOSPITAL_COMMUNITY): Payer: Self-pay

## 2024-12-25 ENCOUNTER — Ambulatory Visit (HOSPITAL_COMMUNITY): Payer: Self-pay | Admitting: Cardiology

## 2024-12-29 ENCOUNTER — Other Ambulatory Visit: Payer: Self-pay

## 2024-12-30 ENCOUNTER — Other Ambulatory Visit

## 2024-12-30 ENCOUNTER — Other Ambulatory Visit: Payer: Self-pay

## 2024-12-30 ENCOUNTER — Other Ambulatory Visit (HOSPITAL_COMMUNITY): Payer: Self-pay

## 2024-12-30 NOTE — Telephone Encounter (Signed)
 Called pt and discussed mri results and need for referral. Pt wanted a fu to discuss crt-d placement before the referral is placed. Got appt scheduled in [facility]. Pt made aware and agreeable. Pt stated that he hasn't received his meds via mail. Reached out to the pharmacy and made sure they had is grant information.

## 2024-12-30 NOTE — Telephone Encounter (Signed)
-----   Message from Ezra Shuck, MD sent at 12/25/2024  4:51 PM EST ----- EF remains low at 30%.  No significant scarring noted.  Would recommend evaluation by EP for CRT-D placement, I think cardiac resynchronization with a biventricular pacemaker would give us  the beset  chance to improve his cardiac function. I can discuss with him in office prior to referral if he prefers that.

## 2024-12-31 ENCOUNTER — Telehealth: Payer: Self-pay

## 2024-12-31 NOTE — Telephone Encounter (Signed)
 Spoke to pt about grant being available until 6/10. Made him aware that his medications are on the way as well as the cost. No further questions at this time.

## 2025-01-04 ENCOUNTER — Telehealth: Payer: Self-pay | Admitting: Pharmacist

## 2025-01-04 NOTE — Telephone Encounter (Signed)
 Called to confirm/remind patient of their appointment at the [facility] on 01/05/25.   Appointment:   [x] Confirmed  [] Left mess   [] No answer/No voice mail  [] VM Full/unable to leave message  [] Phone not in service  Patient reminded to bring all medications and/or complete list.  Confirmed patient has transportation. Gave directions, instructed to utilize valet parking.

## 2025-01-05 ENCOUNTER — Other Ambulatory Visit: Payer: Self-pay

## 2025-01-05 ENCOUNTER — Ambulatory Visit

## 2025-01-05 ENCOUNTER — Other Ambulatory Visit: Payer: Self-pay | Admitting: Pharmacist

## 2025-01-05 ENCOUNTER — Other Ambulatory Visit (HOSPITAL_COMMUNITY): Payer: Self-pay

## 2025-01-05 MED ORDER — SILDENAFIL CITRATE 100 MG PO TABS
100.0000 mg | ORAL_TABLET | ORAL | 2 refills | Status: AC | PRN
  Filled 2025-01-05: qty 10, 10d supply, fill #0

## 2025-01-05 NOTE — Progress Notes (Deleted)
 " [facility] Note  PCP: Nichole Senior, MD PCP-Cardiologist: Evalene Lunger, MD HF-Cardiologist: Ezra Shuck, MD  HPI:  Mr. Casey Hess is a 69 y.o. with history of chronic systolic CHF/nonischemic cardiomyopathy, chronic LBBB, OSA, HTN was referred to CHF clinic by Dr. Gollan.  Patient has a cardiomyopathy known from as far back as 2014.  Echo in 05/2013 showed EF 35-40%.  LHC done in 06/2013 showed minimal luminal irregularities.  There was some concern that heavy ETOH use was contributing to cardiomyopathy. Echo in 05/2023 showed EF 25-30%, and echo in 09/2024 showed EF 20-25% with normal RV.  He has a wide LBBB chronically.  He drank heavily until 09/2024, has now cut back to about 2-3 glasses wine/day.  Nonsmoker.  No strong family history of cardiomyopathy, grandfather had MI at 48.     Patient was seen in Heart Failure Clinic 12/21/24, where patient reported SOB was back to baseline and occasional LEE, though not present at visit. At visit, carvedilol  was increased from 12.5 mg BID to 25 mg BID.   Patient underwent cardiac MRI 12/23/24 which noted: 1. Borderline dilated LV with mild concentric LV hypertrophy. LV EF 30% with diffuse hypokinesis.   2.  Normal RV size with RV EF 28%.   3. Difficult images, but no definite myocardial LGE was visualized. Therefore, no definitive evidence for prior MI, infiltrative disease, or myocarditis.   4. Mildly elevated extracellular volume percentage at 32%, suggesting increased myocardial fibrotic content.  Today Casey Hess. returns to Heart Failure Clinic for pharmacist medication titration. Reports feeling ***. {Reports/Denies:210917258} {ACTIONS;DENIES/REPORTS:21021675::Denies} being able to complete all activities of daily living (ADLs). Is *** active throughout the day. Weight at home is *** pounds. Takes {CHL AMB AH[facility] Medications:210917260} ***. Appetite ***. {Does Follow/Does Not Follow:210917261} a low sodium  diet.  Current Heart Failure Medications: Loop diuretic: none Beta-Blocker: carvedilol  25 mg BID  ACEI/ARB/ARNI: Entresto  97/103 mg BID  MRA: spironolactone  25 mg daily SGLT2i: Farxiga  10 mg daily  Has the patient been experiencing any side effects to the medications prescribed? {yes/no:20286}  Does the patient have any problems obtaining medications due to transportation or finances? {yes/no:20286}  Understanding of regimen: {CHL AMB [facility] Excellent/Good/Fair/Poor:210917262}  Understanding of indications: {CHL AMB [facility] Excellent/Good/Fair/Poor:210917262}  Potential of adherence: {CHL AMB [facility] Excellent/Good/Fair/Poor:210917262}  Patient understands to avoid NSAIDs.  Patient understands to avoid decongestants.  Pertinent Lab Values: Creatinine, Ser  Date Value Ref Range Status  12/21/2024 0.70 0.61 - 1.24 mg/dL Final   BUN  Date Value Ref Range Status  12/21/2024 18 8 - 23 mg/dL Final  88/74/7974 18 8 - 27 mg/dL Final   Potassium  Date Value Ref Range Status  12/21/2024 4.7 3.5 - 5.1 mmol/L Final   Sodium  Date Value Ref Range Status  12/21/2024 141 135 - 145 mmol/L Final  11/10/2024 143 134 - 144 mmol/L Final   BNP  Date Value Ref Range Status  11/10/2024 305.2 (H) 0.0 - 100.0 pg/mL Final    Comment:    [employer] ADVIA Centaur XP methodology   TSH  Date Value Ref Range Status  07/07/2013 2.010 0.450 - 4.500 uIU/mL Final    Vital Signs: There were no vitals filed for this visit.  Assessment/Plan: 1. Chronic systolic CHF: Suspect nonischemic cardiomyopathy.  Echo in 05/2013 with EF 25-30%, LHC in 06/2013 with minimal luminal irregularlities.  Echo in 09/2024 showed EF 20-25%, normal RV.  No family history of cardiomyopathy.  He has a chronic wide LBBB,  possible LBBB cardiomyopathy.  He drank heavily in the past, this also could have contributed.  He has stopped drinking after New Year's. NYHA class II symptoms, not volume overloaded on exam.  Cardiac MRI 12/23/24  noted EF remains low at 30%. No significant scarring. MD recommends evaluation by EP for CRT-D placement, cardiac resynchronization with a biventricular pacemaker would give us  the best chance to improve his cardiac function. Repeat echo per MD in 02/2025 when on maximal tolerated GDMT and off ETOH. If EF remains < 35%, he will need CRT-D device (wide LBBB).   - Continue Entresto  97/103 BID. - Continue carvedilol  25 BID. - Continue Farxiga  10 daily. - Continue spironolactone  25 mg daily.    - Congratulated patient for alcohol cessation over the past several days.  2. HTN: Increasing Entresto  as above.  3. Hyperlipidemia: Myalgias with statin. Continue once weekly Crestor . Can consider adding ezetimibe 10 mg daily. 4. OSA: Continue CPAP.  5. Heavy ETOH: Her reports alcohol cessation after New Year's. Concern that this may be contributing to his cardiomyopathy.  Follow up: ***  Calton Nash, PY4 PharmD Candidate "

## 2025-01-08 ENCOUNTER — Encounter (HOSPITAL_COMMUNITY): Payer: Self-pay | Admitting: Cardiology

## 2025-01-08 ENCOUNTER — Ambulatory Visit (HOSPITAL_COMMUNITY)
Admission: RE | Admit: 2025-01-08 | Discharge: 2025-01-08 | Disposition: A | Source: Ambulatory Visit | Attending: Cardiology | Admitting: Cardiology

## 2025-01-08 VITALS — BP 120/70 | HR 73 | Wt 274.6 lb

## 2025-01-08 DIAGNOSIS — Z7984 Long term (current) use of oral hypoglycemic drugs: Secondary | ICD-10-CM | POA: Insufficient documentation

## 2025-01-08 DIAGNOSIS — I420 Dilated cardiomyopathy: Secondary | ICD-10-CM | POA: Insufficient documentation

## 2025-01-08 DIAGNOSIS — F1090 Alcohol use, unspecified, uncomplicated: Secondary | ICD-10-CM | POA: Diagnosis not present

## 2025-01-08 DIAGNOSIS — Z79899 Other long term (current) drug therapy: Secondary | ICD-10-CM | POA: Diagnosis not present

## 2025-01-08 DIAGNOSIS — I447 Left bundle-branch block, unspecified: Secondary | ICD-10-CM | POA: Diagnosis not present

## 2025-01-08 DIAGNOSIS — I5022 Chronic systolic (congestive) heart failure: Secondary | ICD-10-CM | POA: Diagnosis present

## 2025-01-08 DIAGNOSIS — G4733 Obstructive sleep apnea (adult) (pediatric): Secondary | ICD-10-CM | POA: Diagnosis not present

## 2025-01-08 DIAGNOSIS — E785 Hyperlipidemia, unspecified: Secondary | ICD-10-CM | POA: Insufficient documentation

## 2025-01-08 DIAGNOSIS — I428 Other cardiomyopathies: Secondary | ICD-10-CM | POA: Diagnosis present

## 2025-01-08 DIAGNOSIS — I110 Hypertensive heart disease with heart failure: Secondary | ICD-10-CM | POA: Diagnosis not present

## 2025-01-08 NOTE — Patient Instructions (Addendum)
 There has been no changes to your medications.  You have been referred to the electrophysiology in [place].  KEEP YOUR APPOINTMENT WITH NASON.  If you have any questions or concerns before your next appointment please send us  a message through [place] or call our office at [phone].    TO LEAVE A MESSAGE FOR THE NURSE SELECT OPTION 2, PLEASE LEAVE A MESSAGE INCLUDING: YOUR NAME DATE OF BIRTH CALL BACK NUMBER REASON FOR CALL**this is important as we prioritize the call backs  YOU WILL RECEIVE A CALL BACK THE SAME DAY AS LONG AS YOU CALL BEFORE 4:00 PM  At the [facility], you and your health needs are our priority. As part of our continuing mission to provide you with exceptional heart care, we have created designated Provider Care Teams. These Care Teams include your primary Cardiologist (physician) and Advanced Practice Providers (APPs- Physician Assistants and Nurse Practitioners) who all work together to provide you with the care you need, when you need it.   You may see any of the following providers on your designated Care Team at your next follow up: Dr Toribio Fuel Dr Ezra Shuck Dr. Morene Brownie Greig Mosses, NP Caffie Shed, [place] [facility] [place], [place] Beckey Coe, NP Jordan Lee, NP Ellouise Class, NP Tinnie Redman, PharmD Jaun Bash, PharmD   Please be sure to bring in all your medications bottles to every appointment.    Thank you for choosing [facility]

## 2025-01-08 NOTE — Progress Notes (Signed)
 PCP: Nichole Senior, MD Cardiology: Dr. Perla HF Cardiology: Dr. Rolan  Chief complaint: CHF  69 y.o. with history of chronic systolic CHF/nonischemic cardiomyopathy, chronic LBBB, OSA, HTN was referred by Dr. Gollan for evaluation of CHF.  Patient has a cardiomyopathy known from as far back as 2014.  Echo in 6/14 showed EF 35-40%.  LHC done in 7/14 showed minimal luminal irregularities.  There was some concern that heavy ETOH use was contributing to cardiomyopathy. Echo in 6/24 showed EF 25-30%, and echo in 10/25 showed EF 20-25% with normal RV.  He has a wide LBBB chronically.  He drank heavily until about a month ago, has now cut back to about 2-3 glasses wine/day.  Nonsmoker.  No strong family history of cardiomyopathy, grandfather had MI at 37.    Cardiac MRI in 1/26 showed LV EF 30%, mild LVH, RV EF 28%, no definite delayed enhancement (difficult images), ECV 32%.   Patient returns for followup of CHF.  He is short of breath walking up a flight of stairs.  He fatigues more easily than in the past.  He has a chronic cough.  Weight up 5 lbs.  No orthopnea/PND.  No chest pain.  No palpitations or lightheadedness.  He now drinks ETOH only rarely.   ECG (personally reviewed): NSR, LBBB 156 msec  Labs (7/25): LDL 101, K 4.3, creatinine 0.72 Labs (1/26): K 4.7, creatinine 0.7, pro-BNP 461  PMH: 1. LBBB 2. OSA: Uses CPAP 3. Hyperlipidemia 4. ETOH abuse 5. Hyperlipidemia: Statin myalgias (atorvastatin , daily rosuvastatin ).  6. HTN 7. Chronic systolic CHF: Nonischemic cardiomyopathy. Has wide LBBB.  - Echo (6/14): EF 35-40% - LHC (7/14): Luminal irregularities - Echo (6/24): EF 25-30% - Echo (10/25): EF 20-25%, normal RV, mild MR, IVC dilated - Cardiac MRI (1/26): LV EF 30%, mild LVH, RV EF 28%, no definite delayed enhancement (difficult images), ECV 32%.  SH: Married, lives in [place], owns landscaping company, played football at [employer].   FH: Grandfather with MI at 55.  No SCD or  cardiomyopathy.   ROS: All systems reviewed and negative except as per HPI.   Current Outpatient Medications  Medication Sig Dispense Refill   carvedilol  (COREG ) 25 MG tablet Take 1 tablet (25 mg total) by mouth 2 (two) times daily with a meal. 60 tablet 11   cetirizine (ZYRTEC) 10 MG chewable tablet Chew 10 mg by mouth as needed for allergies.     cyanocobalamin  100 MCG tablet Take 100 mcg by mouth daily.     dapagliflozin  propanediol (FARXIGA ) 10 MG TABS tablet Take 1 tablet (10 mg total) by mouth daily before breakfast. 30 tablet 11   doxazosin  (CARDURA ) 8 MG tablet Take 1 tablet (8 mg total) by mouth daily. 90 tablet 2   Multiple Vitamins-Minerals (ZINC PO) Take by mouth.     rosuvastatin  (CRESTOR ) 5 MG tablet Take 1 tablet (5 mg total) by mouth once a week. 4 tablet 11   sacubitril -valsartan  (ENTRESTO ) 97-103 MG Take 1 tablet by mouth 2 (two) times daily. 60 tablet 6   sildenafil  (VIAGRA ) 100 MG tablet Take 1 tablet (100 mg total) by mouth as needed for erectile dysfunction. 10 tablet 2   spironolactone  (ALDACTONE ) 25 MG tablet Take 1 tablet (25 mg total) by mouth daily. 90 tablet 3   No current facility-administered medications for this encounter.   BP 120/70   Pulse 73   Wt 124.6 kg (274 lb 9.6 oz)   SpO2 98%   BMI 36.23 kg/m  General:  NAD Neck: No JVD, no thyromegaly or thyroid nodule.  Lungs: Occasional rhonchi.  CV: Nondisplaced PMI.  Heart regular S1/S2, no S3/S4, no murmur.  No peripheral edema.  No carotid bruit.  Normal pedal pulses.  Abdomen: Soft, nontender, no hepatosplenomegaly, no distention.  Skin: Intact without lesions or rashes.  Neurologic: Alert and oriented x 3.  Psych: Normal affect. Extremities: No clubbing or cyanosis.  HEENT: Normal.   Assessment/Plan: 1. Chronic systolic CHF: Nonischemic cardiomyopathy.  Echo in 6/14 with EF 25-30%, LHC in 7/14 with minimal luminal irregularlities.  Echo in 10/25 showed EF 20-25%, normal RV.  No family history of  cardiomyopathy.  Cardiac MRI in 1/26 showed LV EF 30%, mild LVH, RV EF 28%, no definite delayed enhancement (difficult images), ECV 32%.  He has a chronic wide LBBB, possible LBBB cardiomyopathy.  He drank heavily in the past, this also could have contributed, but he has now almost quit ETOH. NYHA class II symptoms, not volume overloaded on exam.  - With chronically low EF and wide LBBB on a good medication regimen, I think he needs CRT-D.  Will refer to EP.  - Continue Entresto  97/103 bid with BMET/BNP today.  - Continue Coreg  25 bid - Continue Farxiga  10 daily - Continue spironolactone  25 mg daily.  2. HTN: BP controlled.   3. Hyperlipidemia: Myalgias with statin.  Continue once weekly Crestor .  4. OSA: Continue CPAP.  5. Prior heavy ETOH: He has cut back considerably.   He already has an appt with me in March.    I spent 31 minutes reviewing records, interviewing/examining patient, and managing orders.   Ezra Shuck 01/08/2025 3:18 PM

## 2025-01-09 ENCOUNTER — Other Ambulatory Visit (HOSPITAL_COMMUNITY): Payer: Self-pay

## 2025-01-19 ENCOUNTER — Telehealth: Payer: Self-pay | Admitting: Pharmacist

## 2025-01-19 NOTE — Telephone Encounter (Signed)
 Called to confirm/remind patient of their appointment at the [facility] on 01/20/25.   Appointment:   [x] Confirmed  [] Left mess   [] No answer/No voice mail  [] VM Full/unable to leave message  [] Phone not in service  Patient reminded to bring all medications and/or complete list.  Confirmed patient has transportation. Gave directions, instructed to utilize valet parking.

## 2025-01-20 ENCOUNTER — Ambulatory Visit

## 2025-01-20 VITALS — BP 128/76 | HR 68 | Wt 272.8 lb

## 2025-01-20 DIAGNOSIS — I420 Dilated cardiomyopathy: Secondary | ICD-10-CM

## 2025-01-20 NOTE — Patient Instructions (Signed)
 It was a pleasure seeing you today!  MEDICATIONS: - No changes today!  LABS: -We will call you if your labs need attention.   In general, to take care of your heart failure: -Limit your fluid intake to 2 Liters (half-gallon) per day.   -Limit your salt intake to ideally 2-3 grams (2000-3000 mg) per day. -Weigh yourself daily and record, and bring that weight diary to your next appointment.  (Weight gain of 2-3 pounds in 1 day typically means fluid weight.) -The medications for your heart are to help your heart and help you live longer.   -Please contact us  before stopping any of your heart medications.  Call the clinic at [phone] with questions or to reschedule future appointments.

## 2025-01-22 NOTE — Addendum Note (Signed)
 Encounter addended by: Rolan Ezra RAMAN, MD on: 01/22/2025 11:39 AM  Actions taken: Visit diagnoses modified

## 2025-01-26 ENCOUNTER — Ambulatory Visit: Admitting: Cardiology

## 2025-02-15 ENCOUNTER — Ambulatory Visit: Admitting: Cardiology
